# Patient Record
Sex: Male | Born: 1945 | Race: White | Hispanic: No | Marital: Married | Smoking: Never smoker
Health system: Southern US, Community
[De-identification: ages and names within clinical notes are randomized; demographics above are authoritative.]

## PROBLEM LIST (undated history)

## (undated) DIAGNOSIS — I10 Essential (primary) hypertension: Secondary | ICD-10-CM

## (undated) DIAGNOSIS — R011 Cardiac murmur, unspecified: Secondary | ICD-10-CM

## (undated) DIAGNOSIS — E119 Type 2 diabetes mellitus without complications: Secondary | ICD-10-CM

## (undated) DIAGNOSIS — N400 Enlarged prostate without lower urinary tract symptoms: Secondary | ICD-10-CM

## (undated) DIAGNOSIS — F1021 Alcohol dependence, in remission: Secondary | ICD-10-CM

## (undated) HISTORY — PX: MELANOMA EXCISION: SHX5266

## (undated) HISTORY — PX: FOOT SURGERY: SHX648

## (undated) HISTORY — PX: HERNIA REPAIR: SHX51

## (undated) HISTORY — PX: REPLACEMENT TOTAL KNEE: SUR1224

---
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
## 1978-11-11 HISTORY — PX: NASAL SEPTUM SURGERY: SHX37

## 1988-11-10 HISTORY — PX: CARPAL TUNNEL RELEASE: SHX101

## 2014-03-03 ENCOUNTER — Emergency Department (HOSPITAL_COMMUNITY)
Admission: EM | Admit: 2014-03-03 | Discharge: 2014-03-03 | Disposition: A | Discharge status: Home / Self Care | Payer: Medicare HMO | Attending: Emergency Medicine | Admitting: Emergency Medicine

## 2014-03-03 ENCOUNTER — Emergency Department (HOSPITAL_COMMUNITY): Payer: Medicare HMO

## 2014-03-03 ENCOUNTER — Encounter (HOSPITAL_COMMUNITY): Payer: Self-pay | Admitting: *Deleted

## 2014-03-03 DIAGNOSIS — S76111A Strain of right quadriceps muscle, fascia and tendon, initial encounter: Secondary | ICD-10-CM | POA: Diagnosis not present

## 2014-03-03 DIAGNOSIS — Y998 Other external cause status: Secondary | ICD-10-CM | POA: Diagnosis not present

## 2014-03-03 DIAGNOSIS — Y9389 Activity, other specified: Secondary | ICD-10-CM | POA: Diagnosis not present

## 2014-03-03 DIAGNOSIS — R011 Cardiac murmur, unspecified: Secondary | ICD-10-CM | POA: Diagnosis not present

## 2014-03-03 DIAGNOSIS — I10 Essential (primary) hypertension: Secondary | ICD-10-CM | POA: Diagnosis not present

## 2014-03-03 DIAGNOSIS — E119 Type 2 diabetes mellitus without complications: Secondary | ICD-10-CM | POA: Diagnosis not present

## 2014-03-03 DIAGNOSIS — W1830XA Fall on same level, unspecified, initial encounter: Secondary | ICD-10-CM | POA: Insufficient documentation

## 2014-03-03 DIAGNOSIS — W19XXXA Unspecified fall, initial encounter: Secondary | ICD-10-CM

## 2014-03-03 DIAGNOSIS — Z79899 Other long term (current) drug therapy: Secondary | ICD-10-CM | POA: Diagnosis not present

## 2014-03-03 DIAGNOSIS — Z791 Long term (current) use of non-steroidal anti-inflammatories (NSAID): Secondary | ICD-10-CM | POA: Insufficient documentation

## 2014-03-03 DIAGNOSIS — Y9289 Other specified places as the place of occurrence of the external cause: Secondary | ICD-10-CM | POA: Insufficient documentation

## 2014-03-03 DIAGNOSIS — S8991XA Unspecified injury of right lower leg, initial encounter: Secondary | ICD-10-CM | POA: Diagnosis present

## 2014-03-03 DIAGNOSIS — Z87891 Personal history of nicotine dependence: Secondary | ICD-10-CM | POA: Insufficient documentation

## 2014-03-03 HISTORY — DX: Type 2 diabetes mellitus without complications: E11.9

## 2014-03-03 HISTORY — DX: Essential (primary) hypertension: I10

## 2014-03-03 HISTORY — DX: Cardiac murmur, unspecified: R01.1

## 2014-03-03 MED ORDER — OXYCODONE-ACETAMINOPHEN 5-325 MG PO TABS: 1.0000 | ORAL_TABLET | ORAL | Status: DC | PRN

## 2014-03-03 NOTE — ED Notes (Signed)
Pt slipped on ramp today, denies hitting head, denies blood thinners, c/o right knee pain

## 2014-03-03 NOTE — Discharge Instructions (Signed)
Tendon Injury Tendons are strong, cordlike structures that connect muscle to bone. Tendons are made up of woven fibers, like a rope. A tendon injury is a tear (rupture) of the tendon. The rupture may be partial (only a few of the fibers in your tendon rupture) or complete (your entire tendon ruptures). CAUSES  Tendon injuries can be caused by high-stress activities, such as sports. They also can be caused by a repetitive injury or by a single injury from an excessive, rapid force. SYMPTOMS  Symptoms of tendon injury include pain when you move the joint close to the tendon. Other symptoms are swelling, redness, and warmth. DIAGNOSIS  Tendon injuries often can be diagnosed by physical exam. However, sometimes an X-ray exam or advanced imaging, such as magnetic resonance imaging (MRI), is necessary to determine the extent of the injury. TREATMENT  Partial tendon ruptures often can be treated with immobilization. A splint, bandage, or removable brace usually is used to immobilize the injured tendon. Most injured tendons need to be immobilized for 1-2 months before they are completely healed. Complete tendon ruptures may require surgical reattachment. Document Released: 04/05/2004 Document Revised: 02/15/2011 Document Reviewed: 05/20/2011 [facility] Patient Information 2015 [place], [place]. This information is not intended to replace advice given to you by your health care provider. Make sure you discuss any questions you have with your health care provider.

## 2014-03-03 NOTE — ED Notes (Signed)
Ice pack to lt knee,

## 2014-03-03 NOTE — ED Provider Notes (Signed)
Patient slipped on wet ramp today injuring his right knee. No other associated symptoms complains of right knee pain, nonradiating, anterior pain is worse with movement of his knee. On exam alert Glasgow Coma Score 15. Right lower extremity skin intact. There is a step-off immediately superior to the patella. DP pulse 2+  Orlie Dakin, MD 03/03/14 1441

## 2014-03-03 NOTE — ED Provider Notes (Signed)
CSN: [id number]     Arrival date & time 03/03/14  1205 History   First MD Initiated Contact with Patient 03/03/14 1218     Chief Complaint  Patient presents with  . Fall     (Consider location/radiation/quality/duration/timing/severity/associated sxs/prior Treatment) HPI  Larry Graves is a 68 y.o. male who presents to the Emergency Department complaining of right knee pain since a fall earlier today.  He states that he slipped and fell on a wet ramp, landing on his knee.  He believes that his knee may have twisted or bent bend him as he fell.  He reports a deformity noticed just above his kneecap and felt a "crunching" sensation to his knee when he tried to straighten it.  He has applied ice and reports some improvement in his pain, but states he is still unable to bear weight to his knee or bend it completely.  He denies other injuries, dizziness, vomiting or LOC.  Pt has h/o left [place]KR "years ago" in [place].     Past Medical History  Diagnosis Date  . Hypertension   . Diabetes mellitus without complication   . Heart murmur    Past Surgical History  Procedure Laterality Date  . Replacement total knee    . Carpal tunnel release    . Appendectomy    . Hernia repair    . Nasal septum surgery    . Foot surgery     History reviewed. No pertinent family history. History  Substance Use Topics  . Smoking status: Former Research scientist (life sciences)  . Smokeless tobacco: Not on file  . Alcohol Use: No    Review of Systems  Constitutional: Negative for fever and chills.  Respiratory: Negative for shortness of breath.   Genitourinary: Negative for dysuria and difficulty urinating.  Musculoskeletal: Positive for joint swelling and arthralgias. Negative for neck pain.  Skin: Negative for color change and wound.  Neurological: Negative for dizziness, syncope, facial asymmetry, weakness, numbness and headaches.  All other systems reviewed and are negative.     Allergies  Bacitracin and  Dicloxacillin  Home Medications   Prior to Admission medications   Medication Sig Start Date End Date Taking? Authorizing Provider  lisinopril (PRINIVIL,ZESTRIL) 10 MG tablet Take 10 mg by mouth daily.   Yes Historical Provider, MD  metoprolol succinate (TOPROL-XL) 100 MG 24 hr tablet Take 100 mg by mouth daily. Take with or immediately following a meal.   Yes Historical Provider, MD  Multiple Vitamin (MULTIVITAMIN WITH MINERALS) TABS tablet Take 1 tablet by mouth daily.   Yes Historical Provider, MD  Multiple Vitamins-Minerals (PRESERVISION AREDS 2 PO) Take 1 capsule by mouth daily.   Yes Historical Provider, MD  naproxen (NAPROSYN) 500 MG tablet Take 500 mg by mouth 2 (two) times daily with a meal.   Yes Historical Provider, MD  vitamin B-12 (CYANOCOBALAMIN) 1000 MCG tablet Take 1,000 mcg by mouth daily.   Yes Historical Provider, MD  vitamin C (ASCORBIC ACID) 500 MG tablet Take 500 mg by mouth daily.   Yes Historical Provider, MD   BP 138/91 mmHg  Pulse 84  Temp(Src) 98.3 F (36.8 C) (Oral)  Ht 5\' 3"  (1.6 m)  Wt 180 lb (81.647 kg)  BMI 31.89 kg/m2  SpO2 100% Physical Exam  Constitutional: He is oriented to person, place, and time. He appears well-developed and well-nourished. No distress.  HENT:  Head: Normocephalic and atraumatic.  Neck: Normal range of motion. Neck supple.  Cardiovascular: Normal rate, regular rhythm,  normal heart sounds and intact distal pulses.   No murmur heard. Pulmonary/Chest: Effort normal and breath sounds normal. No respiratory distress.  Musculoskeletal: He exhibits tenderness. He exhibits no edema.  Step off deformity noted proximal to the patella.  Patella is not high riding.  Pt is unable to perform a SLR.   No erythema, or effusion,  DP pulse brisk, distal sensation intact. Compartments are soft.    Neurological: He is alert and oriented to person, place, and time. He exhibits normal muscle tone. Coordination normal.  Skin: Skin is warm and dry. No  erythema.  Psychiatric: He has a normal mood and affect.  Nursing note and vitals reviewed.   ED Course  Procedures (including critical care time) Labs Review Labs Reviewed - No data to display  Imaging Review Mr Knee Right Wo Contrast  03/03/2014   CLINICAL DATA:  Right knee injury today with limited extension, pain and weakness. No previous relevant surgery. Initial encounter.  EXAM: MRI OF THE RIGHT KNEE WITHOUT CONTRAST  TECHNIQUE: Multiplanar, multisequence MR imaging of the knee was performed. No intravenous contrast was administered.  COMPARISON:  None.  FINDINGS: The study is moderately motion degraded, despite repeating several sequences  MENISCI  Medial meniscus: Mild degenerative signal within the posterior horn and body. No evidence of meniscal tear or displaced fragment.  Lateral meniscus:  Intact with normal morphology.  LIGAMENTS  Cruciates:  Intact.  Collaterals:  Intact.  CARTILAGE  Patellofemoral:  Preserved.  Medial:  Minimal chondral thinning without focal defect.  Lateral:  Preserved.  Joint:  Small knee joint effusion.  No loose bodies observed.  Popliteal Fossa:  Unremarkable. No significant Baker's cyst.  Extensor Mechanism: The distal quadriceps tendon is torn just proximal to its insertion on the patella. This appears to reflect a full-thickness tear, although there is no significant tendon retraction. There is extensive anterior extravasation of joint fluid into prepatellar soft tissues. There is edema within the quadriceps musculature. The patellar tendon is intact. There is spurring at the upper and lower poles of the patella.  Bones:  No acute or significant extra-articular osseous findings.  IMPRESSION: 1. Rupture of the distal quadriceps tendon without significant retraction or laxity of the patellar tendon. 2. Associated anterior extravasation of joint fluid. 3. No acute osseous findings.  No evidence of internal derangement.   Electronically Signed   By: Camie Patience  M.D.   On: 03/03/2014 14:21     EKG Interpretation None      MDM   Final diagnoses:  Fall  Tendon rupture, traumatic. quadriceps, right, initial encounter   Injury and exam are concerning for quadriceps tendon tear.  No bony injuries noted.  Will order MRI for further evaluation.    Patient aslo seen Dr. Winfred Leeds.    I have consulted Dr. Aline Brochure.  Recommends knee immobilizer and pt has own walker. Patient advised to be non weight bearing.   Dr. Aline Brochure will see pt in his office on Monday 03/08/14 at 9:00 am. Rx for percocet.  He appears stable for d/c and agrees to plan.    Zeynab Klett L. Ammie Ferrier 03/03/14 [place], MD 03/04/14 [phone]

## 2014-03-08 ENCOUNTER — Other Ambulatory Visit: Payer: Self-pay

## 2014-03-08 ENCOUNTER — Encounter (HOSPITAL_COMMUNITY): Payer: Self-pay

## 2014-03-08 ENCOUNTER — Telehealth: Payer: Self-pay | Admitting: Orthopedic Surgery

## 2014-03-08 ENCOUNTER — Ambulatory Visit (INDEPENDENT_AMBULATORY_CARE_PROVIDER_SITE_OTHER): Payer: Medicare HMO | Admitting: Orthopedic Surgery

## 2014-03-08 ENCOUNTER — Encounter (HOSPITAL_COMMUNITY)
Admission: RE | Admit: 2014-03-08 | Discharge: 2014-03-08 | Disposition: A | Discharge status: Home / Self Care | Payer: Medicare HMO | Source: Ambulatory Visit | Attending: Orthopedic Surgery | Admitting: Orthopedic Surgery

## 2014-03-08 ENCOUNTER — Encounter: Payer: Self-pay | Admitting: Orthopedic Surgery

## 2014-03-08 VITALS — BP 159/107 | Ht 63.0 in | Wt 180.0 lb

## 2014-03-08 DIAGNOSIS — Z79899 Other long term (current) drug therapy: Secondary | ICD-10-CM | POA: Diagnosis not present

## 2014-03-08 DIAGNOSIS — S76111A Strain of right quadriceps muscle, fascia and tendon, initial encounter: Secondary | ICD-10-CM

## 2014-03-08 DIAGNOSIS — Y929 Unspecified place or not applicable: Secondary | ICD-10-CM | POA: Diagnosis not present

## 2014-03-08 DIAGNOSIS — E119 Type 2 diabetes mellitus without complications: Secondary | ICD-10-CM | POA: Diagnosis not present

## 2014-03-08 DIAGNOSIS — Y999 Unspecified external cause status: Secondary | ICD-10-CM | POA: Diagnosis not present

## 2014-03-08 DIAGNOSIS — W010XXA Fall on same level from slipping, tripping and stumbling without subsequent striking against object, initial encounter: Secondary | ICD-10-CM | POA: Diagnosis not present

## 2014-03-08 DIAGNOSIS — I10 Essential (primary) hypertension: Secondary | ICD-10-CM | POA: Diagnosis not present

## 2014-03-08 DIAGNOSIS — Z87891 Personal history of nicotine dependence: Secondary | ICD-10-CM | POA: Diagnosis not present

## 2014-03-08 HISTORY — DX: Alcohol dependence, in remission: F10.21

## 2014-03-08 HISTORY — DX: Benign prostatic hyperplasia without lower urinary tract symptoms: N40.0

## 2014-03-08 LAB — HEMOGLOBIN AND HEMATOCRIT, BLOOD
HEMATOCRIT: 45 % (ref 39.0–52.0)
Hemoglobin: 15.8 g/dL (ref 13.0–17.0)

## 2014-03-08 LAB — BASIC METABOLIC PANEL
Anion gap: 9 (ref 5–15)
BUN: 23 mg/dL (ref 6–23)
CHLORIDE: 100 meq/L (ref 96–112)
CO2: 31 mmol/L (ref 19–32)
Calcium: 9.8 mg/dL (ref 8.4–10.5)
Creatinine, Ser: 0.92 mg/dL (ref 0.50–1.35)
GFR calc Af Amer: >90 mL/min (ref >90)
GFR calc non Af Amer: 86 mL/min — ABNORMAL LOW (ref >90)
GLUCOSE: 136 mg/dL — AB (ref 70–99)
Potassium: 4.3 mmol/L (ref 3.5–5.1)
SODIUM: 140 mmol/L (ref 135–145)

## 2014-03-08 LAB — SURGICAL PCR SCREEN
MRSA, PCR: NEGATIVE
Staphylococcus aureus: NEGATIVE

## 2014-03-08 MED ORDER — CHLORHEXIDINE GLUCONATE 4 % EX LIQD: 60.0000 mL | Freq: Once | CUTANEOUS | Status: DC

## 2014-03-08 NOTE — Addendum Note (Signed)
Addended by: Carole Civil on: 03/08/2014 09:54 AM   Modules accepted: Level of Service

## 2014-03-08 NOTE — Telephone Encounter (Signed)
Regarding out-patient surgery scheduled at [facility], 03/10/14, CPT code [phone], contacted insurer, [employer], [phone]; per Automated Voice Response system,  no pre-authorization is required.  Reference# [id number], received 03/09/14 at 3:29p.m.

## 2014-03-08 NOTE — Patient Instructions (Addendum)
Larry Graves  03/08/2014   Your procedure is scheduled on:  03/10/14  Report to [facility] at 09:00 AM.  Call this number if you have problems the morning of surgery: [phone]   Remember:   Do not eat food or drink liquids after midnight.   Take these medicines the morning of surgery with A SIP OF WATER: Lisinopril. You may take your Oxycodone if needed.   Do not wear jewelry, make-up or nail polish.  Do not wear lotions, powders, or perfumes. You may wear deodorant.  Do not shave 48 hours prior to surgery. Men may shave face and neck.  Do not bring valuables to the hospital.  [facility] is not responsible for any belongings or valuables.               Contacts, dentures or bridgework may not be worn into surgery.  Leave suitcase in the car. After surgery it may be brought to your room.  For patients admitted to the hospital, discharge time is determined by your treatment team.               Patients discharged the day of surgery will not be allowed to drive home.    Special Instructions: Shower using CHG bath (Hibiclens) the night before surgery and the morning of surgery. Use the one bottle for both showers.   Please read over the following fact sheets that you were given: Anesthesia Post-op Instructions    Tendon Repair You have an injured tendon. This is a cord like structure that connects muscles to bones. Muscles and tendons work together to move your arms, legs, fingers, and toes. Your doctor may repair your tendon by sewing it back together. Following this repair the tendon needs to be immobilized (made to not move) and not used to allow it to heal. Sometimes the condition of the wound and the type of damage done may make it necessary to simply clean and repair the wound and then go back in and repair the tendon in about one week to ten days to obtain a better result. Sometimes x-rays are required to make sure there is no additional boney injury. With complete rupture  (break) of a tendon, surgical repair with casting is necessary. Surgery allows the surgeon to put the tendon back together. The cast is used to allow the repair time to heal. The injury may be put in a caste or immobilized for 6 to 10 weeks. Immobilization means that the tendon injured is kept in position with a cast or splint. Once your caregiver feels you have healed well enough following this injury, they will provide exercises you can do to rehabilitate (make better) the injured tendon. HOME CARE INSTRUCTIONS   Do not use the injured tendon for as long as directed by your caregiver.  Rest the tendon as directed. Do not use it for lifting, walking, etc.  Leave the splint or dressing in place for as long as directed by your caregiver. Return for your first dressing change as directed.  Keep the dressing clean and dry.  Keep the injured area raised above the level of your heart as much as possible for the first week. SEEK MEDICAL CARE IF:   You have increased bleeding (more than a small spot) from the wound or from beneath your cast or splint.  You have redness, swelling, or increasing pain in the wound or from beneath your cast or splint.  You have pus coming from the wound or  from beneath the cast or splint.  You notice a bad smell coming from the wound or dressing or from beneath your cast or splint.  You develop increasing pain and swelling, not controlled with medicine. SEEK IMMEDIATE MEDICAL CARE IF:   You have a fever.  You develop a rash.  You have difficulty breathing.  You have any allergic problems. Document Released: 08/22/2000 Document Revised: 05/21/2011 Document Reviewed: 01/13/2007 [facility] Patient Information 2015 [place], [place]. This information is not intended to replace advice given to you by your health care provider. Make sure you discuss any questions you have with your health care provider.      PATIENT INSTRUCTIONS POST-ANESTHESIA  IMMEDIATELY  FOLLOWING SURGERY:  Do not drive or operate machinery for the first twenty four hours after surgery.  Do not make any important decisions for twenty four hours after surgery or while taking narcotic pain medications or sedatives.  If you develop intractable nausea and vomiting or a severe headache please notify your doctor immediately.  FOLLOW-UP:  Please make an appointment with your surgeon as instructed. You do not need to follow up with anesthesia unless specifically instructed to do so.  WOUND CARE INSTRUCTIONS (if applicable):  Keep a dry clean dressing on the anesthesia/puncture wound site if there is drainage.  Once the wound has quit draining you may leave it open to air.  Generally you should leave the bandage intact for twenty four hours unless there is drainage.  If the epidural site drains for more than 36-48 hours please call the anesthesia department.  QUESTIONS?:  Please feel free to call your physician or the hospital operator if you have any questions, and they will be happy to assist you.

## 2014-03-08 NOTE — Patient Instructions (Signed)
SURGERY, RIGHT QUAD TENDON REPAIR, Wednesday 03/10/14

## 2014-03-08 NOTE — Progress Notes (Signed)
Patient ID: Larry Graves, male   DOB: 1945/12/06, 68 y.o.   MRN: [id number] Patient ID: ESSA MALACHI, male   DOB: [birth year], 68 y.o.   MRN: [id number]  Chief Complaint  Patient presents with  . Follow-up    hospital follow up, discuss surgery, right knee, DOI 03/03/14    HPI DAIL LEREW is a 68 y.o. male.   HPI  he was injured on December 23. Slipped on a wet ramp on his shed and it is right knee went to the ER had an MRI which showed a torn quadriceps tendon. His clinical exam warrants at as well.  He complains of mild discomfort inability to extend his knee can walk in a knee immobilizer  I've explained to him the surgical procedure needed to repair his quadriceps tendon. He agrees to surgery.   Past Medical History  Diagnosis Date  . Hypertension   . Diabetes mellitus without complication   . Heart murmur     Past Surgical History  Procedure Laterality Date  . Replacement total knee    . Carpal tunnel release    . Appendectomy    . Hernia repair    . Nasal septum surgery    . Foot surgery      History reviewed. No pertinent family history.  Social History History  Substance Use Topics  . Smoking status: Former Research scientist (life sciences)  . Smokeless tobacco: Not on file  . Alcohol Use: No    Allergies  Allergen Reactions  . Bacitracin Rash  . Dicloxacillin Rash    Current Outpatient Prescriptions  Medication Sig Dispense Refill  . lisinopril (PRINIVIL,ZESTRIL) 10 MG tablet Take 10 mg by mouth daily.    . metFORMIN (GLUCOPHAGE) 500 MG tablet Take by mouth daily with breakfast.    . Multiple Vitamin (MULTIVITAMIN WITH MINERALS) TABS tablet Take 1 tablet by mouth daily.    . Multiple Vitamins-Minerals (PRESERVISION AREDS 2 PO) Take 1 capsule by mouth daily.    . naproxen (NAPROSYN) 500 MG tablet Take 500 mg by mouth 2 (two) times daily with a meal.    . tamsulosin (FLOMAX) 0.4 MG CAPS capsule Take 0.4 mg by mouth.    . vitamin B-12 (CYANOCOBALAMIN) 1000 MCG tablet Take 1,000  mcg by mouth daily.    . vitamin C (ASCORBIC ACID) 500 MG tablet Take 500 mg by mouth daily.    . metoprolol succinate (TOPROL-XL) 100 MG 24 hr tablet Take 100 mg by mouth daily. Take with or immediately following a meal.    . oxyCODONE-acetaminophen (PERCOCET/ROXICET) 5-325 MG per tablet Take 1 tablet by mouth every 4 (four) hours as needed. (Patient not taking: Reported on 03/08/2014) 20 tablet 0   No current facility-administered medications for this visit.    Review of Systems Review of Systems  Blood pressure 159/107, height 5\' 3"  (1.6 m), weight 180 lb (81.647 kg).  Physical Exam Physical Exam  Constitutional: He is oriented to person, place, and time. He appears well-developed and well-nourished. No distress.  HENT:  Head: Normocephalic.  Eyes: Pupils are equal, round, and reactive to light.  Neck: Normal range of motion. Neck supple. No JVD present. No thyromegaly present.  Cardiovascular: Normal rate and intact distal pulses.   Pulmonary/Chest: Effort normal.  Abdominal: Soft. He exhibits no distension.  Lymphadenopathy:    He has no cervical adenopathy.  Neurological: He is alert and oriented to person, place, and time. He has normal reflexes. He exhibits normal muscle tone. Coordination normal.  Upper extremities a re normal   Left leg is normal   Skin: Skin is warm and dry. No rash noted. He is not diaphoretic. No erythema. No pallor.  Psychiatric: He has a normal mood and affect. His behavior is normal. Judgment and thought content normal.  Nursing note and vitals reviewed.  Right leg: Palpable defect in the right suprapatellar region with tenderness of the right quadriceps. Small amount of bruising and ecchymosis around the skin. Neurovascular exam is intact.  The patient cannot do a straight leg raise. We do note that the straight leg raise maneuver is positive for quadriceps tendon rupture. The knee otherwise stable. Passive range of motion is normal to 90 but  painful after that.  Left knee total knee replacement knee flexion 90     Right quadriceps tendon rupture   Open right quadriceps tendon repair  Data Reviewed MRI and x-ray right knee independent review patella is low riding. MRI shows quadriceps tendon rupture   Extensor Mechanism: The distal quadriceps tendon is torn just proximal to its insertion on the patella. This appears to reflect a full-thickness tear, although there is no significant tendon retraction. There is extensive anterior extravasation of joint fluid into prepatellar soft tissues. There is edema within the quadriceps musculature. The patellar tendon is intact. There is spurring at the upper and lower poles of the patella.   Bones:  No acute or significant extra-articular osseous findings.   IMPRESSION: 1. Rupture of the distal quadriceps tendon without significant retraction or laxity of the patellar tendon. 2. Associated anterior extravasation of joint fluid. 3. No acute osseous findings.  No evidence of internal derangement.   Assessment    Encounter Diagnosis  Name Primary?  . Quadriceps muscle rupture, right, initial encounter Yes        Plan    Open repair right quadriceps tendon        Arther Abbott 03/08/2014, 9:14 AM

## 2014-03-09 NOTE — H&P (Signed)
Patient ID: Larry Graves, male   DOB: 05-29-1945, 68 y.o.   MRN: [id number]    Chief Complaint   Patient presents with   .  Follow-up       hospital follow up, discuss surgery, right knee, DOI 03/03/14     HPI Larry Graves is a 68 y.o. male.   HPI he was injured on December 23. Slipped on a wet ramp on his shed and it is right knee went to the ER had an MRI which showed a torn quadriceps tendon. His clinical exam warrants at as well.  He complains of mild discomfort inability to extend his knee can walk in a knee immobilizer  I've explained to him the surgical procedure needed to repair his quadriceps tendon. He agrees to surgery.      Past Medical History   Diagnosis  Date   .  Hypertension     .  Diabetes mellitus without complication     .  Heart murmur         Past Surgical History   Procedure  Laterality  Date   .  Replacement total knee       .  Carpal tunnel release       .  Appendectomy       .  Hernia repair       .  Nasal septum surgery       .  Foot surgery         History reviewed. No pertinent family history.  Social History History   Substance Use Topics   .  Smoking status:  Former Research scientist (life sciences)   .  Smokeless tobacco:  Not on file   .  Alcohol Use:  No       Allergies   Allergen  Reactions   .  Bacitracin  Rash   .  Dicloxacillin  Rash       Current Outpatient Prescriptions   Medication  Sig  Dispense  Refill   .  lisinopril (PRINIVIL,ZESTRIL) 10 MG tablet  Take 10 mg by mouth daily.       .  metFORMIN (GLUCOPHAGE) 500 MG tablet  Take by mouth daily with breakfast.       .  Multiple Vitamin (MULTIVITAMIN WITH MINERALS) TABS tablet  Take 1 tablet by mouth daily.       .  Multiple Vitamins-Minerals (PRESERVISION AREDS 2 PO)  Take 1 capsule by mouth daily.       .  naproxen (NAPROSYN) 500 MG tablet  Take 500 mg by mouth 2 (two) times daily with a meal.       .  tamsulosin (FLOMAX) 0.4 MG CAPS capsule  Take 0.4 mg by mouth.       .  vitamin B-12  (CYANOCOBALAMIN) 1000 MCG tablet  Take 1,000 mcg by mouth daily.       .  vitamin C (ASCORBIC ACID) 500 MG tablet  Take 500 mg by mouth daily.       .  metoprolol succinate (TOPROL-XL) 100 MG 24 hr tablet  Take 100 mg by mouth daily. Take with or immediately following a meal.       .  oxyCODONE-acetaminophen (PERCOCET/ROXICET) 5-325 MG per tablet  Take 1 tablet by mouth every 4 (four) hours as needed. (Patient not taking: Reported on 03/08/2014)  20 tablet  0      No current facility-administered medications for this visit.     Review of Systems Review of Systems  Blood pressure 159/107, height 5\' 3"  (1.6 m), weight 180 lb (81.647 kg).  Physical Exam Physical Exam  Constitutional: He is oriented to person, place, and time. He appears well-developed and well-nourished. No distress.  HENT:   Head: Normocephalic.  Eyes: Pupils are equal, round, and reactive to light.  Neck: Normal range of motion. Neck supple. No JVD present. No thyromegaly present.  Cardiovascular: Normal rate and intact distal pulses.   Pulmonary/Chest: Effort normal.  Abdominal: Soft. He exhibits no distension.  Lymphadenopathy:    He has no cervical adenopathy.  Neurological: He is alert and oriented to person, place, and time. He has normal reflexes. He exhibits normal muscle tone. Coordination normal.  Upper extremities a re normal   Left leg is normal   Skin: Skin is warm and dry. No rash noted. He is not diaphoretic. No erythema. No pallor.  Psychiatric: He has a normal mood and affect. His behavior is normal. Judgment and thought content normal.  Nursing note and vitals reviewed.  Right leg: Palpable defect in the right suprapatellar region with tenderness of the right quadriceps. Small amount of bruising and ecchymosis around the skin. Neurovascular exam is intact.  The patient cannot do a straight leg raise. We do note that the straight leg raise maneuver is positive for quadriceps tendon rupture. The knee  otherwise stable. Passive range of motion is normal to 90 but painful after that.  Left knee total knee replacement knee flexion 90     Right quadriceps tendon rupture   Open right quadriceps tendon repair  Data Reviewed MRI and x-ray right knee independent review patella is low riding. MRI shows quadriceps tendon rupture   Extensor Mechanism: The distal quadriceps tendon is torn just proximal to its insertion on the patella. This appears to reflect a full-thickness tear, although there is no significant tendon retraction. There is extensive anterior extravasation of joint fluid into prepatellar soft tissues. There is edema within the quadriceps musculature. The patellar tendon is intact. There is spurring at the upper and lower poles of the patella.   Bones:  No acute or significant extra-articular osseous findings.   IMPRESSION: 1. Rupture of the distal quadriceps tendon without significant retraction or laxity of the patellar tendon. 2. Associated anterior extravasation of joint fluid. 3. No acute osseous findings.  No evidence of internal derangement.   Assessment    Encounter Diagnosis   Name  Primary?   .  Quadriceps muscle rupture, right, initial encounter  Yes         Plan    Open repair right quadriceps tendon        Arther Abbott 03/08/2014, 9:14 AM  Mri report     Extensor Mechanism: The distal quadriceps tendon is torn just proximal to its insertion on the patella. This appears to reflect a full-thickness tear, although there is no significant tendon retraction. There is extensive anterior extravasation of joint fluid into prepatellar soft tissues. There is edema within the quadriceps musculature. The patellar tendon is intact. There is spurring at the upper and lower poles of the patella.   Bones:  No acute or significant extra-articular osseous findings.   IMPRESSION: 1. Rupture of the distal quadriceps tendon without  significant retraction or laxity of the patellar tendon. 2. Associated anterior extravasation of joint fluid. 3. No acute osseous findings.  No evidence of internal derangement.

## 2014-03-10 ENCOUNTER — Ambulatory Visit (HOSPITAL_COMMUNITY): Payer: Medicare HMO | Admitting: Certified Registered Nurse Anesthetist

## 2014-03-10 ENCOUNTER — Encounter (HOSPITAL_COMMUNITY): Payer: Self-pay | Admitting: *Deleted

## 2014-03-10 ENCOUNTER — Ambulatory Visit (HOSPITAL_COMMUNITY)
Admission: RE | Admit: 2014-03-10 | Discharge: 2014-03-10 | Disposition: A | Discharge status: Home / Self Care | Payer: Medicare HMO | Source: Ambulatory Visit | Attending: Orthopedic Surgery | Admitting: Orthopedic Surgery

## 2014-03-10 ENCOUNTER — Encounter (HOSPITAL_COMMUNITY)
Admission: RE | Disposition: A | Discharge status: Home / Self Care | Payer: Self-pay | Source: Ambulatory Visit | Attending: Orthopedic Surgery

## 2014-03-10 DIAGNOSIS — S76111A Strain of right quadriceps muscle, fascia and tendon, initial encounter: Secondary | ICD-10-CM | POA: Diagnosis not present

## 2014-03-10 DIAGNOSIS — E119 Type 2 diabetes mellitus without complications: Secondary | ICD-10-CM | POA: Insufficient documentation

## 2014-03-10 DIAGNOSIS — S76119A Strain of unspecified quadriceps muscle, fascia and tendon, initial encounter: Secondary | ICD-10-CM | POA: Insufficient documentation

## 2014-03-10 DIAGNOSIS — I10 Essential (primary) hypertension: Secondary | ICD-10-CM | POA: Diagnosis not present

## 2014-03-10 DIAGNOSIS — Z87891 Personal history of nicotine dependence: Secondary | ICD-10-CM | POA: Insufficient documentation

## 2014-03-10 DIAGNOSIS — Y929 Unspecified place or not applicable: Secondary | ICD-10-CM | POA: Insufficient documentation

## 2014-03-10 DIAGNOSIS — Z79899 Other long term (current) drug therapy: Secondary | ICD-10-CM | POA: Insufficient documentation

## 2014-03-10 DIAGNOSIS — Y999 Unspecified external cause status: Secondary | ICD-10-CM | POA: Insufficient documentation

## 2014-03-10 DIAGNOSIS — W010XXA Fall on same level from slipping, tripping and stumbling without subsequent striking against object, initial encounter: Secondary | ICD-10-CM | POA: Insufficient documentation

## 2014-03-10 HISTORY — PX: QUADRICEPS TENDON REPAIR: SHX756

## 2014-03-10 LAB — GLUCOSE, CAPILLARY
GLUCOSE-CAPILLARY: 136 mg/dL — AB (ref 70–99)
Glucose-Capillary: 151 mg/dL — ABNORMAL HIGH (ref 70–99)

## 2014-03-10 SURGERY — REPAIR, TENDON, QUADRICEPS
Anesthesia: General | Site: Knee | Laterality: Right

## 2014-03-10 MED ORDER — KETOROLAC TROMETHAMINE 30 MG/ML IJ SOLN
30.0000 mg | Freq: Once | INTRAMUSCULAR | Status: AC
  Administered 2014-03-10: 30 mg via INTRAVENOUS

## 2014-03-10 MED ORDER — FENTANYL CITRATE 0.05 MG/ML IJ SOLN
INTRAMUSCULAR | Status: AC
  Filled 2014-03-10: qty 2

## 2014-03-10 MED ORDER — BUPIVACAINE-EPINEPHRINE (PF) 0.5% -1:200000 IJ SOLN
INTRAMUSCULAR | Status: AC
  Filled 2014-03-10: qty 60

## 2014-03-10 MED ORDER — HYDROCODONE-ACETAMINOPHEN 5-325 MG PO TABS
2.0000 | ORAL_TABLET | Freq: Once | ORAL | Status: AC
  Administered 2014-03-10: 2 via ORAL

## 2014-03-10 MED ORDER — PROMETHAZINE HCL 12.5 MG PO TABS: 12.5000 mg | ORAL_TABLET | Freq: Four times a day (QID) | ORAL | Status: DC | PRN

## 2014-03-10 MED ORDER — FENTANYL CITRATE 0.05 MG/ML IJ SOLN
25.0000 ug | INTRAMUSCULAR | Status: AC
  Administered 2014-03-10 (×2): 25 ug via INTRAVENOUS

## 2014-03-10 MED ORDER — ONDANSETRON HCL 4 MG/2ML IJ SOLN
4.0000 mg | Freq: Once | INTRAMUSCULAR | Status: AC
  Administered 2014-03-10: 4 mg via INTRAVENOUS

## 2014-03-10 MED ORDER — FENTANYL CITRATE 0.05 MG/ML IJ SOLN
INTRAMUSCULAR | Status: DC | PRN
  Administered 2014-03-10 (×2): 25 ug via INTRAVENOUS
  Administered 2014-03-10 (×2): 50 ug via INTRAVENOUS
  Administered 2014-03-10 (×2): 25 ug via INTRAVENOUS

## 2014-03-10 MED ORDER — PROPOFOL 10 MG/ML IV BOLUS
INTRAVENOUS | Status: DC | PRN
  Administered 2014-03-10: 170 mg via INTRAVENOUS

## 2014-03-10 MED ORDER — MIDAZOLAM HCL 2 MG/2ML IJ SOLN
1.0000 mg | INTRAMUSCULAR | Status: DC | PRN
Start: 2014-03-10 — End: 2014-03-10
  Administered 2014-03-10: 2 mg via INTRAVENOUS

## 2014-03-10 MED ORDER — PROPOFOL 10 MG/ML IV BOLUS
INTRAVENOUS | Status: AC
  Filled 2014-03-10: qty 20

## 2014-03-10 MED ORDER — ONDANSETRON HCL 4 MG/2ML IJ SOLN
4.0000 mg | Freq: Once | INTRAMUSCULAR | Status: DC | PRN
  Filled 2014-03-10: qty 2

## 2014-03-10 MED ORDER — MIDAZOLAM HCL 2 MG/2ML IJ SOLN
INTRAMUSCULAR | Status: AC
  Filled 2014-03-10: qty 2

## 2014-03-10 MED ORDER — EPHEDRINE SULFATE 50 MG/ML IJ SOLN
INTRAMUSCULAR | Status: DC | PRN
  Administered 2014-03-10: 10 mg via INTRAVENOUS

## 2014-03-10 MED ORDER — VANCOMYCIN HCL IN DEXTROSE 1-5 GM/200ML-% IV SOLN
INTRAVENOUS | Status: AC
  Filled 2014-03-10: qty 200

## 2014-03-10 MED ORDER — KETOROLAC TROMETHAMINE 30 MG/ML IJ SOLN
INTRAMUSCULAR | Status: AC
  Filled 2014-03-10: qty 1

## 2014-03-10 MED ORDER — ONDANSETRON HCL 4 MG/2ML IJ SOLN
INTRAMUSCULAR | Status: AC
  Filled 2014-03-10: qty 2

## 2014-03-10 MED ORDER — FENTANYL CITRATE 0.05 MG/ML IJ SOLN
25.0000 ug | INTRAMUSCULAR | Status: DC | PRN
  Administered 2014-03-10 (×2): 50 ug via INTRAVENOUS

## 2014-03-10 MED ORDER — VANCOMYCIN HCL IN DEXTROSE 1-5 GM/200ML-% IV SOLN
1000.0000 mg | INTRAVENOUS | Status: AC
  Administered 2014-03-10: 1000 mg via INTRAVENOUS

## 2014-03-10 MED ORDER — LIDOCAINE HCL (PF) 1 % IJ SOLN
INTRAMUSCULAR | Status: AC
  Filled 2014-03-10: qty 5

## 2014-03-10 MED ORDER — 0.9 % SODIUM CHLORIDE (POUR BTL) OPTIME
TOPICAL | Status: DC | PRN
  Administered 2014-03-10: 1000 mL

## 2014-03-10 MED ORDER — HYDROCODONE-ACETAMINOPHEN 5-325 MG PO TABS
ORAL_TABLET | ORAL | Status: AC
  Filled 2014-03-10: qty 2

## 2014-03-10 MED ORDER — SODIUM CHLORIDE 0.9 % IJ SOLN
INTRAMUSCULAR | Status: AC
  Filled 2014-03-10: qty 10

## 2014-03-10 MED ORDER — LACTATED RINGERS IV SOLN
INTRAVENOUS | Status: DC
  Administered 2014-03-10: 08:00:00 via INTRAVENOUS

## 2014-03-10 MED ORDER — BUPIVACAINE-EPINEPHRINE 0.5% -1:200000 IJ SOLN
INTRAMUSCULAR | Status: DC | PRN
  Administered 2014-03-10: 45 mL

## 2014-03-10 MED ORDER — OXYCODONE-ACETAMINOPHEN 5-325 MG PO TABS: 1.0000 | ORAL_TABLET | ORAL | Status: DC | PRN

## 2014-03-10 MED ORDER — EPHEDRINE SULFATE 50 MG/ML IJ SOLN
INTRAMUSCULAR | Status: AC
  Filled 2014-03-10: qty 1

## 2014-03-10 MED ORDER — LIDOCAINE HCL (CARDIAC) 20 MG/ML IV SOLN
INTRAVENOUS | Status: DC | PRN
  Administered 2014-03-10: 50 mg via INTRAVENOUS

## 2014-03-10 SURGICAL SUPPLY — 48 items
BAG HAMPER (MISCELLANEOUS) ×2 IMPLANT
BANDAGE ESMARK 4X12 BL STRL LF (DISPOSABLE) ×1 IMPLANT
BIT DRILL 2.8X128 (BIT) ×2 IMPLANT
BLADE 10 SAFETY STRL DISP (BLADE) ×2 IMPLANT
BNDG COHESIVE 4X5 TAN STRL (GAUZE/BANDAGES/DRESSINGS) ×2 IMPLANT
BNDG ESMARK 4X12 BLUE STRL LF (DISPOSABLE) ×2
CHLORAPREP W/TINT 26ML (MISCELLANEOUS) ×2 IMPLANT
CLOTH BEACON ORANGE TIMEOUT ST (SAFETY) ×2 IMPLANT
COVER LIGHT HANDLE STERIS (MISCELLANEOUS) ×4 IMPLANT
CUFF TOURNIQUET SINGLE 24IN (TOURNIQUET CUFF) ×2 IMPLANT
CUFF TOURNIQUET SINGLE 34IN LL (TOURNIQUET CUFF) IMPLANT
DRAPE INCISE IOBAN 66X45 STRL (DRAPES) ×2 IMPLANT
DRSG MEPILEX BORDER 4X12 (GAUZE/BANDAGES/DRESSINGS) ×2 IMPLANT
GAUZE XEROFORM 5X9 LF (GAUZE/BANDAGES/DRESSINGS) ×2 IMPLANT
GLOVE BIOGEL M 7.0 STRL (GLOVE) ×2 IMPLANT
GLOVE BIOGEL PI IND STRL 7.0 (GLOVE) ×2 IMPLANT
GLOVE BIOGEL PI INDICATOR 7.0 (GLOVE) ×2
GLOVE ECLIPSE 7.0 STRL STRAW (GLOVE) ×2 IMPLANT
GLOVE EXAM NITRILE MD LF STRL (GLOVE) ×2 IMPLANT
GLOVE SKINSENSE NS SZ8.0 LF (GLOVE) ×1
GLOVE SKINSENSE STRL SZ8.0 LF (GLOVE) ×1 IMPLANT
GLOVE SS N UNI LF 8.5 STRL (GLOVE) ×2 IMPLANT
GOWN STRL REUS W/TWL LRG LVL3 (GOWN DISPOSABLE) ×4 IMPLANT
GOWN STRL REUS W/TWL XL LVL3 (GOWN DISPOSABLE) ×2 IMPLANT
IMMOBILIZER KNEE 19 UNV (ORTHOPEDIC SUPPLIES) ×2 IMPLANT
INST SET MAJOR BONE (KITS) ×2 IMPLANT
KIT ROOM TURNOVER APOR (KITS) ×2 IMPLANT
MANIFOLD NEPTUNE II (INSTRUMENTS) ×2 IMPLANT
NEEDLE HYPO 21X1.5 SAFETY (NEEDLE) ×2 IMPLANT
NEEDLE MAYO 6 CRC TAPER PT (NEEDLE) IMPLANT
NS IRRIG 1000ML POUR BTL (IV SOLUTION) ×2 IMPLANT
PACK BASIC LIMB (CUSTOM PROCEDURE TRAY) ×2 IMPLANT
PAD ARMBOARD 7.5X6 YLW CONV (MISCELLANEOUS) ×2 IMPLANT
PASSER SUT SWANSON 36MM LOOP (INSTRUMENTS) IMPLANT
SET BASIN LINEN APH (SET/KITS/TRAYS/PACK) ×2 IMPLANT
SPONGE LAP 18X18 X RAY DECT (DISPOSABLE) ×2 IMPLANT
STAPLER VISISTAT 35W (STAPLE) ×2 IMPLANT
SUT BRALON NAB BRD #1 30IN (SUTURE) IMPLANT
SUT ETHIBOND 5 LR DA (SUTURE) ×6 IMPLANT
SUT ETHIBOND NAB OS 4 #2 30IN (SUTURE) IMPLANT
SUT ETHILON 3 0 FSL (SUTURE) IMPLANT
SUT MON AB 0 CT1 (SUTURE) ×4 IMPLANT
SUT MON AB 2-0 CT1 36 (SUTURE) IMPLANT
SUT PROLENE 3 0 PS 1 (SUTURE) IMPLANT
SUT VIC AB 1 CT1 27 (SUTURE) ×4
SUT VIC AB 1 CT1 27XBRD ANTBC (SUTURE) ×4 IMPLANT
SYR 30ML LL (SYRINGE) ×2 IMPLANT
SYR BULB IRRIGATION 50ML (SYRINGE) ×2 IMPLANT

## 2014-03-10 NOTE — Op Note (Signed)
03/10/2014  10:27 AM  PATIENT:  Larry Graves  68 y.o. male  PRE-OPERATIVE DIAGNOSIS:  right quadricept tendon tear  POST-OPERATIVE DIAGNOSIS:  right quadricept tendon tear  PROCEDURE:  Procedure(s): REPAIR QUADRICEP TENDON (Right)   Findings: complete rupture from patella with medial retinacular tear   SURGEON:  Surgeon(s) and Role:    * Carole Civil, MD - Primary  PHYSICIAN ASSISTANT:   ASSISTANTS: nicole small   ANESTHESIA:   general  EBL:     BLOOD ADMINISTERED:none  DRAINS: none   LOCAL MEDICATIONS USED:  MARCAINE   With epi 45cc  SPECIMEN:  No Specimen  DISPOSITION OF SPECIMEN:  N/A  COUNTS:  YES  TOURNIQUET:  * Missing tourniquet times found for documented tourniquets in log:  [id number] *  DICTATION: .Dragon Dictation  PLAN OF CARE: Discharge to home after PACU  PATIENT DISPOSITION:  PACU - hemodynamically stable.   Delay start of Pharmacological VTE agent (>24hrs) due to surgical blood loss or risk of bleeding: not applicable  Details of procedure the patient was identified in the preop holding area. Chart review was completed. Right lower extremity was marked with an indelible marker.  The patient was taken to the operating room for general anesthesia in the supine position.  Vancomycin was used as antibiotic due to previous history of staph infections.  After successful intubation right lower extremity was prepped with ChloraPrep draped sterilely and time out was completed. The limb was extended with a 4 inch Esmarch.  Midline incision was made starting above the patella and taken distally to the level of tibial tubercle. Subcutaneous tissue was divided. Incision was taken down to the quadriceps tendon patella and patellar tendon.  The tendon was avulsed from the patella. The joint was value weighted irrigated and there was no evidence of loose body or significant patellofemoral arthritis  Patella was debrided and the quadriceps tendon was  debrided sharply. A rongeur was used to remove bone from the patella to create a bleeding bone bed.  2 Krakw stitches were placed in the quadriceps tendon using #5 Ethibond suture. 3 drill holes were placed in the patella. A suture passer was used to pass the stitches and the stitches were tied with the knee in full extension.  The knee was flexed to 40 before there was any tension on the repair. We then placed a sterile #5 suture around the patella to decrease tension on the repair and we repaired the retinaculum with #1 running Vicryl. Wound was irrigated. Joint was injected with 30 mL of Marcaine with epinephrine and the skin was injected with 50 mL of Marcaine with epinephrine  Subcutaneous stitches were placed in interrupted fashion with 0 Monocryl  Skin reapproximated with staples  Sterile dressing was applied  The patient was placed in a knee immobilizer and extubated taken to recovery room in stable condition  Postoperative plan weightbearing as tolerated with knee immobilizer  When his staples come out we can place him in a T scope range of motion brace starting 0-30 range of motion at postop week #6 and increasing 20-30 until full range of motion as obtained.

## 2014-03-10 NOTE — Anesthesia Preprocedure Evaluation (Addendum)
Anesthesia Evaluation  Patient identified by MRN, date of birth, ID band Patient awake    Reviewed: Allergy & Precautions, H&P , NPO status , Patient's Chart, lab work & pertinent test results  History of Anesthesia Complications Negative for: history of anesthetic complications  Airway Mallampati: I  TM Distance: >3 FB     Dental  (+) Edentulous Upper, Edentulous Lower   Pulmonary neg pulmonary ROS, former smoker,          Cardiovascular hypertension, Pt. on medications Rhythm:Regular Rate:Normal     Neuro/Psych    GI/Hepatic negative GI ROS,   Endo/Other  diabetes, Well Controlled, Type 2, Oral Hypoglycemic Agents  Renal/GU      Musculoskeletal   Abdominal   Peds  Hematology   Anesthesia Other Findings   Reproductive/Obstetrics                            Anesthesia Physical Anesthesia Plan  ASA: II  Anesthesia Plan: General   Post-op Pain Management:    Induction: Intravenous  Airway Management Planned: LMA  Additional Equipment:   Intra-op Plan:   Post-operative Plan: Extubation in OR  Informed Consent: I have reviewed the patients History and Physical, chart, labs and discussed the procedure including the risks, benefits and alternatives for the proposed anesthesia with the patient or authorized representative who has indicated his/her understanding and acceptance.     Plan Discussed with:   Anesthesia Plan Comments:        Anesthesia Quick Evaluation

## 2014-03-10 NOTE — Interval H&P Note (Signed)
History and Physical Interval Note:  03/10/2014 8:30 AM  Larry Graves  has presented today for surgery, with the diagnosis of right quadricept tendon tear  The various methods of treatment have been discussed with the patient and family. After consideration of risks, benefits and other options for treatment, the patient has consented to  Procedure(s): REPAIR QUADRICEP TENDON (Right) as a surgical intervention .  The patient's history has been reviewed, patient examined, no change in status, stable for surgery.  I have reviewed the patient's chart and labs.  Questions were answered to the patient's satisfaction.     Arther Abbott

## 2014-03-10 NOTE — Brief Op Note (Signed)
03/10/2014  10:27 AM  PATIENT:  Larry Graves  68 y.o. male  PRE-OPERATIVE DIAGNOSIS:  right quadricept tendon tear  POST-OPERATIVE DIAGNOSIS:  right quadricept tendon tear  PROCEDURE:  Procedure(s): REPAIR QUADRICEP TENDON (Right)   Findings: complete rupture from patella with medial retinacular tear   SURGEON:  Surgeon(s) and Role:    * Carole Civil, MD - Primary  PHYSICIAN ASSISTANT:   ASSISTANTS: nicole small   ANESTHESIA:   general  EBL:     BLOOD ADMINISTERED:none  DRAINS: none   LOCAL MEDICATIONS USED:  MARCAINE   With epi 45cc  SPECIMEN:  No Specimen  DISPOSITION OF SPECIMEN:  N/A  COUNTS:  YES  TOURNIQUET:  * Missing tourniquet times found for documented tourniquets in log:  [id number] *  DICTATION: .Dragon Dictation  PLAN OF CARE: Discharge to home after PACU  PATIENT DISPOSITION:  PACU - hemodynamically stable.   Delay start of Pharmacological VTE agent (>24hrs) due to surgical blood loss or risk of bleeding: not applicable  Details of procedure the patient was identified in the preop holding area. Chart review was completed. Right lower extremity was marked with an indelible marker.  The patient was taken to the operating room for general anesthesia in the supine position.  Vancomycin was used as antibiotic due to previous history of staph infections.  After successful intubation right lower extremity was prepped with ChloraPrep draped sterilely and time out was completed. The limb was extended with a 4 inch Esmarch.  Midline incision was made starting above the patella and taken distally to the level of tibial tubercle. Subcutaneous tissue was divided. Incision was taken down to the quadriceps tendon patella and patellar tendon.  The tendon was avulsed from the patella. The joint was value weighted irrigated and there was no evidence of loose body or significant patellofemoral arthritis  Patella was debrided and the quadriceps tendon was  debrided sharply. A rongeur was used to remove bone from the patella to create a bleeding bone bed.  2 Krakw stitches were placed in the quadriceps tendon using #5 Ethibond suture. 3 drill holes were placed in the patella. A suture passer was used to pass the stitches and the stitches were tied with the knee in full extension.  The knee was flexed to 40 before there was any tension on the repair. We then placed a sterile #5 suture around the patella to decrease tension on the repair and we repaired the retinaculum with #1 running Vicryl. Wound was irrigated. Joint was injected with 30 mL of Marcaine with epinephrine and the skin was injected with 50 mL of Marcaine with epinephrine  Subcutaneous stitches were placed in interrupted fashion with 0 Monocryl  Skin reapproximated with staples  Sterile dressing was applied  The patient was placed in a knee immobilizer and extubated taken to recovery room in stable condition  Postoperative plan weightbearing as tolerated with knee immobilizer  When his staples come out we can place him in a T scope range of motion brace starting 0-30 range of motion at postop week #6 and increasing 20-30 until full range of motion as obtained.

## 2014-03-10 NOTE — Transfer of Care (Signed)
Immediate Anesthesia Transfer of Care Note  Patient: Larry Graves  Procedure(s) Performed: Procedure(s): REPAIR QUADRICEP TENDON (Right)  Patient Location: PACU  Anesthesia Type:General  Level of Consciousness: awake, alert  and oriented  Airway & Oxygen Therapy: Patient Spontanous Breathing and Patient connected to face mask oxygen  Post-op Assessment: Report given to PACU RN and Post -op Vital signs reviewed and stable  Post vital signs: Reviewed and stable  Complications: No apparent anesthesia complications

## 2014-03-11 ENCOUNTER — Encounter (HOSPITAL_COMMUNITY): Payer: Self-pay | Admitting: Orthopedic Surgery

## 2014-03-11 NOTE — Anesthesia Postprocedure Evaluation (Signed)
  Anesthesia Post-op Note  Late entry Patient: Larry Graves  Procedure(s) Performed: Procedure(s): REPAIR QUADRICEP TENDON (Right)  Patient Location: PACU  Anesthesia Type:General  Level of Consciousness: awake, alert , oriented and patient cooperative  Airway and Oxygen Therapy: Patient Spontanous Breathing  Post-op Pain: mild  Post-op Assessment: Post-op Vital signs reviewed, Patient's Cardiovascular Status Stable, Respiratory Function Stable, Patent Airway, No signs of Nausea or vomiting and Pain level controlled  Post-op Vital Signs: Reviewed and stable  Last Vitals:  Filed Vitals:   03/10/14 1138  BP: 149/73  Pulse: 86  Temp: 36.6 C  Resp: 16    Complications: No apparent anesthesia complications

## 2014-03-15 ENCOUNTER — Encounter: Payer: Self-pay | Admitting: Orthopedic Surgery

## 2014-03-15 ENCOUNTER — Ambulatory Visit (INDEPENDENT_AMBULATORY_CARE_PROVIDER_SITE_OTHER): Payer: Self-pay | Admitting: Orthopedic Surgery

## 2014-03-15 VITALS — BP 160/96 | Ht 63.0 in | Wt 182.0 lb

## 2014-03-15 DIAGNOSIS — S76111A Strain of right quadriceps muscle, fascia and tendon, initial encounter: Secondary | ICD-10-CM

## 2014-03-15 MED ORDER — HYDROCODONE-ACETAMINOPHEN 7.5-325 MG PO TABS
1.0000 | ORAL_TABLET | Freq: Four times a day (QID) | ORAL | Status: DC | PRN
Start: 2014-03-15 — End: 2014-05-06

## 2014-03-15 NOTE — Progress Notes (Signed)
Patient ID: Larry Graves, male   DOB: Apr 02, 1945, 69 y.o.   MRN: [id number] Postop visit #1  Encounter Diagnosis  Name Primary?  . Quadriceps muscle rupture, right, initial encounter Yes    Status post quadriceps tendon repair right knee  Wound looks clean dressing is changed. Calf is supple Homans sign is negative.  Continue knee immobilizer return on the 13th for staples out and application of T score brace 0-30

## 2014-03-23 ENCOUNTER — Ambulatory Visit: Payer: Medicare HMO | Admitting: Orthopedic Surgery

## 2014-03-24 ENCOUNTER — Ambulatory Visit (INDEPENDENT_AMBULATORY_CARE_PROVIDER_SITE_OTHER): Payer: Self-pay | Admitting: Orthopedic Surgery

## 2014-03-24 VITALS — BP 92/68 | Ht 63.0 in | Wt 182.0 lb

## 2014-03-24 DIAGNOSIS — S76111D Strain of right quadriceps muscle, fascia and tendon, subsequent encounter: Secondary | ICD-10-CM

## 2014-03-24 NOTE — Progress Notes (Signed)
Patient ID: Larry Graves, male   DOB: [birth year], 69 y.o.   MRN: [id number] Chief Complaint  Patient presents with  . Follow-up    post op 2, Rt Quad Tendon, DOS 03/10/14    Quadriceps tendon repair right knee suture line looks good staples taken out patient placed in hinged brace 0-30 weight-bear as tolerated come back 2 weeks advance 30

## 2014-04-08 ENCOUNTER — Ambulatory Visit (INDEPENDENT_AMBULATORY_CARE_PROVIDER_SITE_OTHER): Payer: Self-pay | Admitting: Orthopedic Surgery

## 2014-04-08 ENCOUNTER — Encounter: Payer: Self-pay | Admitting: Orthopedic Surgery

## 2014-04-08 VITALS — BP 142/92 | Ht 63.0 in | Wt 182.0 lb

## 2014-04-08 DIAGNOSIS — S76111D Strain of right quadriceps muscle, fascia and tendon, subsequent encounter: Secondary | ICD-10-CM

## 2014-04-08 NOTE — Progress Notes (Signed)
Chief Complaint  Patient presents with  . Follow-up    2 week recheck on right quad tendon repair, DOS 03-10-14.    The patient came in today to advance his brace from 30 up to 60. He has some wound issue at the top of his wound and in the middle of the wound there is a scab. When he took his last bandage off it pulled the scab off and left some of the skin pulled off with it. There is mild erythema around the area but there is no joint effusion no sign of infection  Recommend dry sterile dressings. I did suggest a triple antibiotic ointment but he is allergic to bacitracin so we aborted that  Recommend come back 2 weeks advance his brace to 90

## 2014-04-22 ENCOUNTER — Encounter: Payer: Self-pay | Admitting: Orthopedic Surgery

## 2014-04-22 ENCOUNTER — Ambulatory Visit (INDEPENDENT_AMBULATORY_CARE_PROVIDER_SITE_OTHER): Payer: Self-pay | Admitting: Orthopedic Surgery

## 2014-04-22 VITALS — BP 137/93 | Ht 63.0 in | Wt 182.0 lb

## 2014-04-22 DIAGNOSIS — S76111D Strain of right quadriceps muscle, fascia and tendon, subsequent encounter: Secondary | ICD-10-CM

## 2014-04-22 NOTE — Patient Instructions (Signed)
WBAT IN BRACE BEND KNEE IN BRACE

## 2014-04-22 NOTE — Progress Notes (Signed)
Chief Complaint  Patient presents with  . Follow-up    follow up Right quad tendon, advance brace, DOS 03/10/14    6 weeks postop right quad repair is in a hinged brace. Brace 0-60 per graph he has active quadriceps extension 30-0. Advance brace 0-90 return 2 weeks advance brace 0-120

## 2014-05-06 ENCOUNTER — Encounter: Payer: Self-pay | Admitting: Orthopedic Surgery

## 2014-05-06 ENCOUNTER — Ambulatory Visit (INDEPENDENT_AMBULATORY_CARE_PROVIDER_SITE_OTHER): Payer: Self-pay | Admitting: Orthopedic Surgery

## 2014-05-06 VITALS — BP 157/104 | Ht 63.0 in | Wt 182.0 lb

## 2014-05-06 DIAGNOSIS — S76111D Strain of right quadriceps muscle, fascia and tendon, subsequent encounter: Secondary | ICD-10-CM

## 2014-05-06 NOTE — Patient Instructions (Signed)
Call to arrange therapy at [facility] therapy dept

## 2014-05-06 NOTE — Progress Notes (Signed)
Chief Complaint  Patient presents with  . Follow-up    follow up Right quad tendon, DOS 03/10/14    Encounter Diagnosis  Name Primary?  . Rupture of right quadriceps tendon, subsequent encounter Yes     Brace Avastin 120  Patient has terminal knee extension with mild extensor lag. Eschar scab healing well  No treatment for that.  Continue ambulation start therapy follow-up 4 weeks

## 2014-05-12 ENCOUNTER — Ambulatory Visit (HOSPITAL_COMMUNITY): Payer: Medicare PPO | Attending: Orthopedic Surgery | Admitting: Physical Therapy

## 2014-05-12 DIAGNOSIS — Z9889 Other specified postprocedural states: Secondary | ICD-10-CM

## 2014-05-12 DIAGNOSIS — R262 Difficulty in walking, not elsewhere classified: Secondary | ICD-10-CM | POA: Diagnosis not present

## 2014-05-12 DIAGNOSIS — M25661 Stiffness of right knee, not elsewhere classified: Secondary | ICD-10-CM | POA: Insufficient documentation

## 2014-05-12 NOTE — Therapy (Signed)
[address] [place] [place], [name], [address] Phone: [phone]   Fax:  [phone]  Physical Therapy Evaluation  Patient Details  Name: Larry Graves MRN: [id number] Date of Birth: 21-Feb-1946 Referring Provider:  Carole Civil, MD  Encounter Date: 05/12/2014      PT End of Session - 05/12/14 1506    Visit Number 1   Number of Visits 18   Date for PT Re-Evaluation 06/11/14   Authorization Type A[employer]na Medicare   Authorization - Visit Number 1   Authorization - Number of Visits 10   PT Start Time 1430   PT Stop Time 1515   PT Time Calculation (min) 45 min   Activity Tolerance Patient tolerated treatment well   Behavior During Therapy [facility] for tasks assessed/performed      Past Medical History  Diagnosis Date  . Hypertension   . Diabetes mellitus without complication   . Heart murmur   . BPH (benign prostatic hyperplasia)   . History of alcoholism     Past Surgical History  Procedure Laterality Date  . Replacement total knee Left 2000?    [facility] in [place], [place]  . Carpal tunnel release Bilateral 1990's    [facility] in [place], [place]  . Nasal septum surgery  1980's    [facility] [place], [place]  . Foot surgery Left 2010?    bunionectomy-[facility] in [place], [place]  . Appendectomy  [facility] in [place], [place]  . Hernia repair  1953?    [facility] in [place], [place]  . Melanoma excision      on face-all done in office   . Quadriceps tendon repair Right 03/10/2014    Procedure: REPAIR QUADRICEP TENDON;  Surgeon: Carole Civil, MD;  Location: [facility];  Service: Orthopedics;  Laterality: Right;    There were no vitals taken for this visit.  Visit Diagnosis:  Knee stiffness, right  Hx of right knee surgery  Difficulty walking      Subjective Assessment - 05/12/14 1438    Pertinent History 03/10/14 Rt ratellar tendon repair following complete tear, Patient's  hobbies ionclude: camoping, fishing, hunting, working in [employer]. Patient's wound severely escarded secondary to history of a blood blister on Rt knee bursting resulting in excessive escar and scabbing. patient is retired.  no previous therapy   How long can you walk comfortably? walking with walker 1 hour   Patient Stated Goals bowling,fishing, ellyptical, hunting, to be bale to walk daily for 1 hour or greater.    Currently in Pain? Yes   Pain Score 0-No pain   Pain Location Knee   Pain Orientation Right          [facility] PT Assessment - 05/12/14 0001    Assessment   Medical Diagnosis Rt patellar tendon repair.    Onset Date 03/10/14   Next MD Visit Aline Brochure.    Prior Therapy no   Precautions   Precaution Comments Diabetes, good ciculation.    Balance Screen   Has the patient fallen in the past 6 months No   Has the patient had a decrease in activity level because of a fear of falling?  No   Is the patient reluctant to leave their home because of a fear of falling?  No   Prior Function   Level of Independence Independent with basic ADLs   Vocation Retired   [employer]  Overall Cognitive Status Within Functional Limits for tasks assessed   Functional Tests   Functional tests Sit to Stand;Other;Other2   AROM   AROM Assessment Site Hip;Knee;Ankle   Right/Left Hip Right;Left   Right Hip External Rotation  32   Right Hip Internal Rotation  16   Left Hip External Rotation  50   Left Hip Internal Rotation  18   Right/Left Knee Right;Left   Right Knee Extension -2   Right Knee Flexion 40   Left Knee Extension 0   Left Knee Flexion 90   Strength   Strength Assessment Site Hip;Knee;Ankle   Right/Left Hip Right;Left   Right Hip Flexion 4+/5   Right Hip Extension 5/5   Left Hip Flexion 5/5   Left Hip Extension 5/5   Right/Left Knee Right;Left   Right Knee Flexion 4/5   Right Knee Extension 4/5   Left Knee Flexion 5/5   Left Knee Extension 5/5            [facility]  Adult PT Treatment/Exercise - 05/12/14 0001    Exercises   Exercises Knee/Hip   Knee/Hip Exercises: Stretches   Active Hamstring Stretch 2 reps;20 seconds   Hip Flexor Stretch 2 reps;20 seconds   Gastroc Stretch 20 seconds;2 reps            PT Education - 05/12/14 1506    Education provided Yes   Education Details Diagnosis, prognosis, and Le exercises for HEp as performed this sessopn.    Person(s) Educated Patient   Methods Explanation;Demonstration;Handout   Comprehension Verbalized understanding;Returned demonstration          PT Short Term Goals - 05/12/14 1529    PT SHORT TERM GOAL #1   Title patient will demonstrate increased Rt knee flexion to 60 degrees to be able to stand from high chairs without using UEs for support.    Baseline 40 degrees Rt knee flexion   Time 2   Period Weeks   Status New   PT SHORT TERM GOAL #2   Title Patient will be able to fully extend knee to normalize stride mechanics of gait.    Time 3   Period Weeks   Status New   PT SHORT TERM GOAL #3   Title Patient will demonstrate independence with HEP.    Time 3   Period Weeks   Status New   PT SHORT TERM GOAL #4   Title Patient will dmeonstrate increased hip intenernal totion ROm to 25 degrees to improve decelration mechanics   Time 3   Period Weeks   Status New           PT Long Term Goals - 05/12/14 1526    PT LONG TERM GOAL #1   Title patient will demonstrate increased Rt knee flexion to 90 degrees to be able to stand from typical chair without using UEs for support   Time 4   PT LONG TERM GOAL #2   Title patient will demonstrate increased Rt knee flexion to >100 degrees to be able to stand from typical chair without using UEs for support   Time 6   Period Weeks   Status New   PT LONG TERM GOAL #3   Title Patient will dmeonstrate 5/5 MMT for Rt knee flexion and extension to ambulate up and down stairs withtou assistance.    Time 6   Period Weeks   Status New   PT LONG  TERM GOAL #4   Title Patient wll be  able to ambulate withtou assistive device.    Time 6   Period Weeks   Status New   PT LONG TERM GOAL #5   Title Patient will dmeonstrate increased hip intenernal rototion ROm to 35 degrees to improve decelration mechanics   Time 6   Period Weeks   Status New               Plan - 06/06/14 1508    Clinical Impression Statement Patient displays Rt knee stiffness resulting in difficulty walking and scquatting s/p Rt petellar tendon reconstruction. Specifically patient displasy severe limitaqtion in Rt knee flexion (40 degrees) resulting in inability to flex tknee to stand from low chair. Patient will benefit fom skilled physical therapy to increase Rt knee AROm so patient can return to working in garden, and performing his usual hobbies.    Pt will benefit from skilled therapeutic intervention in order to improve on the following deficits Abnormal gait;Decreased endurance;Improper body mechanics;Impaired flexibility;Decreased strength;Difficulty walking;Pain;Decreased range of motion   Rehab Potential Good   PT Frequency 3x / week   PT Duration 6 weeks   PT Next Visit Plan Progress stretches to multidirectional, introduce piriformis stretch and 3 way knee driver   PT Home Exercise Plan Calf, hamstring, hip flexor stetch with anterior knee driver    Consulted and Agree with Plan of Care Patient          G-Codes - 2014-06-06 1531    Functional Assessment Tool Used Clinical judgment 70% limited based on Rt knee flexion of 40 degrees out of 130 normal ROM   Functional Limitation Mobility: Walking and moving around   Mobility: Walking and Moving Around Current Status [phone]) At least 60 percent but less than 80 percent impaired, limited or restricted   Mobility: Walking and Moving Around Goal Status [phone]) At least 40 percent but less than 60 percent impaired, limited or restricted       Problem List Patient Active Problem List   Diagnosis Date  Noted  . Quadriceps muscle rupture    Devona Konig PT DPT [place], [name], [address] Phone: [phone]   Fax:  [phone]

## 2014-05-18 ENCOUNTER — Ambulatory Visit (HOSPITAL_COMMUNITY): Payer: Medicare HMO | Admitting: Specialist

## 2014-05-19 ENCOUNTER — Ambulatory Visit (HOSPITAL_COMMUNITY): Payer: Medicare PPO | Admitting: Physical Therapy

## 2014-05-19 DIAGNOSIS — R262 Difficulty in walking, not elsewhere classified: Secondary | ICD-10-CM

## 2014-05-19 DIAGNOSIS — Z9889 Other specified postprocedural states: Secondary | ICD-10-CM

## 2014-05-19 DIAGNOSIS — M25661 Stiffness of right knee, not elsewhere classified: Secondary | ICD-10-CM | POA: Diagnosis not present

## 2014-05-19 NOTE — Therapy (Signed)
[place], [name], [address] Phone: [phone]   Fax:  [phone]  Physical Therapy Treatment  Patient Details  Name: Larry Graves MRN: [id number] Date of Birth: Nov 09, 1945 Referring Provider:  Delphina Cahill, MD  Encounter Date: 05/19/2014      PT End of Session - 05/19/14 1700    Visit Number 2   Number of Visits 18   Date for PT Re-Evaluation 06/11/14   Authorization Type Aetna Medicare   Authorization - Visit Number 2   Authorization - Number of Visits 10   PT Start Time 7253   PT Stop Time 1645   PT Time Calculation (min) 41 min   Activity Tolerance Patient tolerated treatment well   Behavior During Therapy [facility] for tasks assessed/performed      Past Medical History  Diagnosis Date  . Hypertension   . Diabetes mellitus without complication   . Heart murmur   . BPH (benign prostatic hyperplasia)   . History of alcoholism     Past Surgical History  Procedure Laterality Date  . Replacement total knee Left 2000?    [facility] in [place], [place]  . Carpal tunnel release Bilateral 1990's    [facility] in [place], [place]  . Nasal septum surgery  1980's    [facility] [place], [place]  . Foot surgery Left 2010?    [facility]unionectomy-[facility] in [place], [place]  . Appendectomy  [facility] in [place], [place]  . Hernia repair  1953?    [facility] in [place], [place]  . Melanoma excision      on face-all done in office   . Quadriceps tendon repair Right 03/10/2014    Procedure: REPAIR QUADRICEP TENDON;  Surgeon: Carole Civil, MD;  Location: [facility];  Service: Orthopedics;  Laterality: Right;    There were no vitals taken for this visit.  Visit Diagnosis:  Knee stiffness, right  Hx of right knee surgery  Difficulty walking      Subjective Assessment - 05/19/14 1618    Symptoms Patient states that his knee is feeling better and his uninvolved side is feelign better  as well.    Currently in Pain? Yes   Pain Score 2    Pain Location Knee   Pain Orientation Right                    [facility] Adult PT Treatment/Exercise - 05/19/14 0001    Knee/Hip Exercises: Stretches   Active Hamstring Stretch 2 reps;20 seconds   Active Hamstring Stretch Limitations 3way to 12"   Hip Flexor Stretch 2 reps;20 seconds   Hip Flexor Stretch Limitations to 6"   Knee: Self-Stretch Limitations 10x 3 seconds 3 way    Gastroc Stretch 20 seconds;2 reps   Gastroc Stretch Limitations 3 way on floor   Knee/Hip Exercises: Standing   Other Standing Knee Exercises 3D hip excursions with knee brace on Rt.                   PT Short Term Goals - 05/19/14 1703    PT SHORT TERM GOAL #1   Title patient will demonstrate increased Rt knee flexion to 60 degrees to be able to stand from high chairs without using UEs for support.    Status On-going   PT SHORT TERM GOAL #2   Title Patient will be able to fully extend knee  to normalize stride mechanics of gait.    Status On-going   PT SHORT TERM GOAL #3   Title Patient will demonstrate independence with HEP.    Status On-going   PT SHORT TERM GOAL #4   Title Patient will dmeonstrate increased hip intenernal totion ROm to 25 degrees to improve decelration mechanics   Status On-going           PT Long Term Goals - 05/19/14 1704    PT LONG TERM GOAL #1   Title patient will demonstrate increased Rt knee flexion to 90 degrees to be able to stand from typical chair without using UEs for support   Status On-going   PT LONG TERM GOAL #2   Title patient will demonstrate increased Rt knee flexion to >100 degrees to be able to stand from typical chair without using UEs for support   Status On-going   PT LONG TERM GOAL #3   Title Patient will dmeonstrate 5/5 MMT for Rt knee flexion and extension to ambulate up and down stairs withtou assistance.    Status On-going   PT LONG TERM GOAL #4   Title Patient wll be able to  ambulate withtou assistive device.    Status On-going   PT LONG TERM GOAL #5   Title Patient will dmeonstrate increased hip intenernal rototion ROm to 35 degrees to improve deceleration mechanics   Status On-going               Plan - 05/19/14 1701    Clinical Impression Statement Patient displasy already improvign ROm win Rt knee with improved ability to flex Rt knee and strighten it during gait resulting in patient able to ambualte without walker. Patient demosntrates improved gait with decreased requirement on UE support as patient can now ambulate with a cain.    PT Next Visit Plan decreaase repetitions of stretches, introduce piriformis stretch and prone quadriceps stretch, then introduce walking hip excursions with brace.         Problem List Patient Active Problem List   Diagnosis Date Noted  . Quadriceps muscle rupture    Devona Konig PT DPT [place], [name], [address] Phone: [phone]   Fax:  [phone]

## 2014-05-21 ENCOUNTER — Ambulatory Visit (HOSPITAL_COMMUNITY): Payer: Medicare PPO

## 2014-05-21 DIAGNOSIS — M25661 Stiffness of right knee, not elsewhere classified: Secondary | ICD-10-CM | POA: Diagnosis not present

## 2014-05-21 DIAGNOSIS — R262 Difficulty in walking, not elsewhere classified: Secondary | ICD-10-CM

## 2014-05-21 DIAGNOSIS — Z9889 Other specified postprocedural states: Secondary | ICD-10-CM

## 2014-05-21 NOTE — Therapy (Signed)
[place], [name], [address] Phone: [phone]   Fax:  [phone]  Physical Therapy Treatment  Patient Details  Name: Larry Graves MRN: [id number] Date of Birth: 12-10-1945 Referring Provider:  Delphina Cahill, MD  Encounter Date: 05/21/2014      PT End of Session - 05/21/14 1655    Visit Number 3   Number of Visits 18   Date for PT Re-Evaluation 06/11/14   Authorization Type Aetna Medicare   Authorization - Visit Number 3   Authorization - Number of Visits 10   PT Start Time 0998   PT Stop Time 1645   PT Time Calculation (min) 39 min   Activity Tolerance Patient tolerated treatment well   Behavior During Therapy [facility] for tasks assessed/performed      Past Medical History  Diagnosis Date  . Hypertension   . Diabetes mellitus without complication   . Heart murmur   . BPH (benign prostatic hyperplasia)   . History of alcoholism     Past Surgical History  Procedure Laterality Date  . Replacement total knee Left 2000?    [facility] in [place], [place]  . Carpal tunnel release Bilateral 1990's    [facility] in [place], [place]  . Nasal septum surgery  1980's    [facility] [place], [place]  . Foot surgery Left 2010?    [facility]ectomy-[facility] in [place], [place]  . Appendectomy  [facility] in [place], [place]  . Hernia repair  1953?    [facility] in [place], [place]  . Melanoma excision      on face-all done in office   . Quadriceps tendon repair Right 03/10/2014    Procedure: REPAIR QUADRICEP TENDON;  Surgeon: Carole Civil, MD;  Location: [facility];  Service: Orthopedics;  Laterality: Right;    There were no vitals filed for this visit.  Visit Diagnosis:  Knee stiffness, right  Hx of right knee surgery  Difficulty walking      Subjective Assessment - 05/21/14 1610    Symptoms Pain free right now.  Compliant with exercises had a question about brace on or off with  gastroc stretches   Currently in Pain? No/denies            [facility] PT Assessment - 05/21/14 0001    Assessment   Medical Diagnosis Rt patellar tendon repair.    Onset Date 03/10/14   Next MD Visit Aline Brochure 06/03/2014.    Prior Therapy no   Precautions   Precaution Comments Diabetes, good ciculation.                    [place] [facility] Adult PT Treatment/Exercise - 05/21/14 0001    Exercises   Exercises Knee/Hip   Knee/Hip Exercises: Stretches   Active Hamstring Stretch 2 reps;20 seconds   Active Hamstring Stretch Limitations 3way to 12"   Quad Stretch 3 reps;30 seconds   Hip Flexor Stretch 2 reps;20 seconds   Hip Flexor Stretch Limitations to 12"; Groin step 2x 20   Knee: Self-Stretch Limitations 10x 3 seconds 3 way    Piriformis Stretch 3 reps;30 seconds   Piriformis Stretch Limitations manual   Gastroc Stretch 2 reps;20 seconds   Gastroc Stretch Limitations slant board   Knee/Hip Exercises: Standing   Gait Training Hip excursion walking 2RT   Other Standing Knee Exercises 3D hip excursions with knee brace on Rt split stance  with Rt behind for increased weight bearing                  PT Short Term Goals - 05/21/14 1710    PT SHORT TERM GOAL #1   Title patient will demonstrate increased Rt knee flexion to 60 degrees to be able to stand from high chairs without using UEs for support.    Status On-going   PT SHORT TERM GOAL #2   Title Patient will be able to fully extend knee to normalize stride mechanics of gait.    Status On-going   PT SHORT TERM GOAL #3   Title Patient will demonstrate independence with HEP.    Baseline Reports daily   Status Achieved   PT SHORT TERM GOAL #4   Title Patient will dmeonstrate increased hip intenernal totion ROm to 25 degrees to improve decelration mechanics   Status On-going           PT Long Term Goals - 05/21/14 1711    PT LONG TERM GOAL #1   Title patient will demonstrate increased Rt knee flexion to 90 degrees  to be able to stand from typical chair without using UEs for support   PT LONG TERM GOAL #2   Title patient will demonstrate increased Rt knee flexion to >100 degrees to be able to stand from typical chair without using UEs for support   PT LONG TERM GOAL #3   Title Patient will dmeonstrate 5/5 MMT for Rt knee flexion and extension to ambulate up and down stairs withtou assistance.    PT LONG TERM GOAL #4   Title Patient wll be able to ambulate withtou assistive device.    PT LONG TERM GOAL #5   Title Patient will dmeonstrate increased hip intenernal rototion ROm to 35 degrees to improve deceleration mechanics               Plan - 05/21/14 1656    Clinical Impression Statement Began piriformis and quad stretches with manual assistance required due to tight hip musculatyre.  Added walking hip excursion to improve hip mobilty with cueing for techniques with brace on.  Stretches were complete without brace on.  No reports of pain through sessoin.   PT Next Visit Plan Continues with stretches to improve flexibility and excursion walking wiht brace on to improve hip mobility.        Problem List Patient Active Problem List   Diagnosis Date Noted  . Quadriceps muscle rupture    Ihor Austin, [place]  Aldona Lento 05/21/2014, 5:13 PM  [place] [address] [place], [name], [address] Phone: [phone]   Fax:  [phone]

## 2014-05-25 ENCOUNTER — Ambulatory Visit (HOSPITAL_COMMUNITY): Payer: Medicare PPO

## 2014-05-25 DIAGNOSIS — Z9889 Other specified postprocedural states: Secondary | ICD-10-CM

## 2014-05-25 DIAGNOSIS — M25661 Stiffness of right knee, not elsewhere classified: Secondary | ICD-10-CM | POA: Diagnosis not present

## 2014-05-25 DIAGNOSIS — R262 Difficulty in walking, not elsewhere classified: Secondary | ICD-10-CM

## 2014-05-25 NOTE — Therapy (Signed)
[place], [name], [address] Phone: [phone]   Fax:  [phone]  Physical Therapy Treatment  Patient Details  Name: Larry Graves MRN: [id number] Date of Birth: [birth year] Referring Provider:  Delphina Cahill, MD  Encounter Date: 05/25/2014      PT End of Session - 05/25/14 1620    Visit Number 4   Number of Visits 18   Date for PT Re-Evaluation 06/11/14   Authorization Type Aetna Medicare   Authorization - Visit Number 4   Authorization - Number of Visits 10   PT Start Time 0258   PT Stop Time 1645   PT Time Calculation (min) 53 min   Activity Tolerance Patient tolerated treatment well   Behavior During Therapy [facility] for tasks assessed/performed      Past Medical History  Diagnosis Date  . Hypertension   . Diabetes mellitus without complication   . Heart murmur   . BPH (benign prostatic hyperplasia)   . History of alcoholism     Past Surgical History  Procedure Laterality Date  . Replacement total knee Left 2000?    [facility] in [place], [place]  . Carpal tunnel release Bilateral 1990's    [facility] in [place], [place]  . Nasal septum surgery  1980's    [facility] in [place], [place]  . Foot surgery Left 2010?    [facility] in [place], [place]  . Appendectomy  [facility] in [place], [place]  . Hernia repair  1953?    [facility] in [place], [place]  . Melanoma excision      on face-all done in office   . Quadriceps tendon repair Right 03/10/2014    Procedure: REPAIR QUADRICEP TENDON;  Surgeon: Larry Civil, MD;  Location: [facility];  Service: Orthopedics;  Laterality: Right;    There were no vitals filed for this visit.  Visit Diagnosis:  Knee stiffness, right  Hx of right knee surgery  Difficulty walking      Subjective Assessment - 05/25/14 1607    Symptoms Pt stated he was a liffle sore following last session, no reports of pain through  session.   Currently in Pain? No/denies                       [facility] Adult PT Treatment/Exercise - 05/25/14 0001    Exercises   Exercises Knee/Hip   Knee/Hip Exercises: Stretches   Active Hamstring Stretch 3 reps;30 seconds   Active Hamstring Stretch Limitations 3way to 12"   Quad Stretch 3 reps;30 seconds   Piriformis Stretch 3 reps;30 seconds   Piriformis Stretch Limitations manual   Gastroc Stretch 3 reps;30 seconds   Gastroc Stretch Limitations slant board   Knee/Hip Exercises: Standing   Rocker Board 2 minutes   Rocker Board Limitations R/L and A/P   Gait Training Hip excursion walking 2RT   Other Standing Knee Exercises 3D hip excursions with knee brace on Rt split stance with Rt behind for increased weight bearing                PT Education - 05/25/14 1657    Education provided Yes   Education Details Wife instructed piriformis and quad stretches; Education on proper alighment with brace   Person(s) Educated Patient;Spouse   Methods Explanation;Demonstration;Verbal cues   Comprehension Verbalized understanding;Returned demonstration          PT  Short Term Goals - 05/25/14 1656    PT SHORT TERM GOAL #1   Title patient will demonstrate increased Rt knee flexion to 60 degrees to be able to stand from high chairs without using UEs for support.    Baseline 3/15/216 78 degrees flexion   Status Partially Met   PT SHORT TERM GOAL #2   Title Patient will be able to fully extend knee to normalize stride mechanics of gait.    Status On-going   PT SHORT TERM GOAL #3   Title Patient will demonstrate independence with HEP.    Status Achieved   PT SHORT TERM GOAL #4   Title Patient will dmeonstrate increased hip intenernal totion ROm to 25 degrees to improve decelration mechanics   Status On-going           PT Long Term Goals - 05/25/14 1657    PT LONG TERM GOAL #1   Title patient will demonstrate increased Rt knee flexion to 90 degrees to be  able to stand from typical chair without using UEs for support   Status On-going   PT LONG TERM GOAL #2   Title patient will demonstrate increased Rt knee flexion to >100 degrees to be able to stand from typical chair without using UEs for support   Status On-going   PT LONG TERM GOAL #3   Title Patient will dmeonstrate 5/5 MMT for Rt knee flexion and extension to ambulate up and down stairs withtou assistance.    PT LONG TERM GOAL #4   Title Patient wll be able to ambulate withtou assistive device.    PT LONG TERM GOAL #5   Title Patient will dmeonstrate increased hip intenernal rototion ROm to 35 degrees to improve deceleration mechanics               Plan - 05/25/14 1620    Clinical Impression Statement Adjusted and instructed proper alignment for knee brace with increased ease for knee flexion with activities.  Added rockerboard to improve weight distribution with gait.  Continued split stance with Rt foot behind for increased weight bearing with therapist facilitation to improve form.  Pt continues to exhibit tight hip musculature.  Pt's wife instructed manual stretches for piriformis and quad to improve AROM.   PT Next Visit Plan Continues with stretches to improve flexibility and excursion walking wiht brace on to improve hip mobility.        Problem List Patient Active Problem List   Diagnosis Date Noted  . Quadriceps muscle rupture    Larry [place], [place]  Aldona Lento 05/25/2014, 5:04 PM  [facility] [address] [place], [name], [address] Phone: [phone]   Fax:  [phone]

## 2014-05-26 ENCOUNTER — Ambulatory Visit (HOSPITAL_COMMUNITY): Payer: Medicare PPO

## 2014-05-26 DIAGNOSIS — Z9889 Other specified postprocedural states: Secondary | ICD-10-CM

## 2014-05-26 DIAGNOSIS — M25661 Stiffness of right knee, not elsewhere classified: Secondary | ICD-10-CM | POA: Diagnosis not present

## 2014-05-26 DIAGNOSIS — R262 Difficulty in walking, not elsewhere classified: Secondary | ICD-10-CM

## 2014-05-26 NOTE — Patient Instructions (Signed)
Hip Extension   Lie on back, legs in air, knees bent. Grasp hands behind one thigh and cross other leg over same thigh. Hold 30  seconds. Repeat with other leg held. Repeat 3  times. Do 2 sessions per day.  Copyright  VHI. All rights reserved.   Internal Rotation / Flexion: Stretch - Piriformis (Supine)   Position (A) Patient: Keep trunk steady. Helper: Support left leg with both hands. Motion (B) - Helper lifts leg, bending hip and knee. - Move knee toward opposite shoulder. - Patient should feel stretch in buttock. Hold 30 seconds. Repeat 3  times. Repeat with other leg. Do 2 sessions per day.  Copyright  VHI. All rights reserved.

## 2014-05-26 NOTE — Therapy (Signed)
[place], [name], [address] Phone: [phone]   Fax:  [phone]  Physical Therapy Treatment  Patient Details  Name: Larry Graves MRN: [id number] Date of Birth: 1945/07/09 Referring Provider:  Delphina Cahill, MD  Encounter Date: 05/26/2014      PT End of Session - 05/26/14 1545    Visit Number 5   Number of Visits 18   Date for PT Re-Evaluation 06/11/14   Authorization Type A[employer] Medicare   Authorization - Visit Number 5   Authorization - Number of Visits 10   PT Start Time 1518   PT Stop Time 1612   PT Time Calculation (min) 54 min   Activity Tolerance Patient tolerated treatment well   Behavior During Therapy [facility] for tasks assessed/performed      Past Medical History  Diagnosis Date  . Hypertension   . Diabetes mellitus without complication   . Heart murmur   . BPH (benign prostatic hyperplasia)   . History of alcoholism     Past Surgical History  Procedure Laterality Date  . Replacement total knee Left 2000?    [facility] in [place], [place]  . Carpal tunnel release Bilateral 1990's    [facility] in [place], [place]  . Nasal septum surgery  1980's    [facility] [place], [place]  . Foot surgery Left 2010?    bunionectomy-[facility] in [place], [place]  . Appendectomy  [place] in [place], [place]  . Hernia repair  1953?    [facility] in [place], [place]  . Melanoma excision      on face-all done in office   . Quadriceps tendon repair Right 03/10/2014    Procedure: REPAIR QUADRICEP TENDON;  Surgeon: Carole Civil, MD;  Location: [facility];  Service: Orthopedics;  Laterality: Right;    There were no vitals filed for this visit.  Visit Diagnosis:  Knee stiffness, right  Hx of right knee surgery  Difficulty walking      Subjective Assessment - 05/26/14 1541    Symptoms Pt stated no pain today, knee is just stiff. Reported wife would like to review  stretches instructed yesterday   Currently in Pain? No/denies            [facility] PT Assessment - 05/26/14 0001    Assessment   Medical Diagnosis Rt patellar tendon repair.    Onset Date 03/10/14   Next MD Visit Aline Brochure 06/03/2014.    Prior Therapy no   Precautions   Precaution Comments Diabetes, good ciculation.                    [facility] Adult PT Treatment/Exercise - 05/26/14 1544    Exercises   Exercises Knee/Hip   Knee/Hip Exercises: Stretches   Active Hamstring Stretch 3 reps;30 seconds   Active Hamstring Stretch Limitations 3way to 14"   Quad Stretch 3 reps;30 seconds   Quad Stretch Limitations prone with rope   Knee: Self-Stretch Limitations knee drives 3 directions 23R 3" on 6 in step   ITB Stretch 3 reps;30 seconds   ITB Stretch Limitations beside 6in step   Piriformis Stretch 3 reps;30 seconds   Piriformis Stretch Limitations manual   Gastroc Stretch 3 reps;30 seconds   Gastroc Stretch Limitations slant board   Knee/Hip Exercises: Standing   Rocker Board 2 minutes   Rocker Board Limitations R/L and A/P   Personnel officer  Hip excursion walking 2RT   Other Standing Knee Exercises 3D hip excursions with knee brace on Rt split stance with Rt behind for increased weight bearing                PT Education - 05/26/14 1616    Education provided Yes   Education Details Reviewed stretches with pt and wife for correct form, HEP printout given   Person(s) Educated Patient;Spouse   Methods Explanation;Demonstration;Handout   Comprehension Verbalized understanding;Returned demonstration          PT Short Term Goals - 05/26/14 1614    PT SHORT TERM GOAL #1   Title patient will demonstrate increased Rt knee flexion to 60 degrees to be able to stand from high chairs without using UEs for support.    PT SHORT TERM GOAL #2   Title Patient will be able to fully extend knee to normalize stride mechanics of gait.    PT SHORT TERM GOAL #3   Title Patient will  demonstrate independence with HEP.    Status Achieved   PT SHORT TERM GOAL #4   Title Patient will dmeonstrate increased hip intenernal totion ROm to 25 degrees to improve decelration mechanics   Status On-going           PT Long Term Goals - 05/26/14 1615    PT LONG TERM GOAL #1   Title patient will demonstrate increased Rt knee flexion to 90 degrees to be able to stand from typical chair without using UEs for support   PT LONG TERM GOAL #2   Title patient will demonstrate increased Rt knee flexion to >100 degrees to be able to stand from typical chair without using UEs for support   PT LONG TERM GOAL #3   Title Patient will dmeonstrate 5/5 MMT for Rt knee flexion and extension to ambulate up and down stairs withtou assistance.    PT LONG TERM GOAL #4   Title Patient wll be able to ambulate withtou assistive device.    PT LONG TERM GOAL #5   Title Patient will dmeonstrate increased hip intenernal rototion ROm to 35 degrees to improve deceleration mechanics               Plan - 05/26/14 1546    Clinical Impression Statement Pt reported knee brace feels better with increased ability to bend knee during gait following adjustment last session.  Continued session focus on improving hip mobility and stretches for flexibilty.  Reviewed quad and piriformis stretch with pt and wife to assure correct form and technique, able to perform with minimal difficulty, given HEP printout.  Pt with improved stance phase with gait and hip mobilty is progressing.  No reports of pain through session.     PT Next Visit Plan Continues with stretches to improve flexibility and excursion walking with brace on to improve hip mobility.  Next session begin sit to stands without HHA from lowest height able and progress quad strenghtening for knee extension, continue progressing flexion as able.          Problem List Patient Active Problem List   Diagnosis Date Noted  . Quadriceps muscle rupture     Ihor Austin, [place]  Aldona Lento 05/26/2014, 4:21 PM  [facility] [address] [place], [name], [address] Phone: [phone]   Fax:  [phone]

## 2014-05-27 ENCOUNTER — Ambulatory Visit (HOSPITAL_COMMUNITY): Payer: Medicare PPO

## 2014-05-27 DIAGNOSIS — Z9889 Other specified postprocedural states: Secondary | ICD-10-CM

## 2014-05-27 DIAGNOSIS — R262 Difficulty in walking, not elsewhere classified: Secondary | ICD-10-CM

## 2014-05-27 DIAGNOSIS — M25661 Stiffness of right knee, not elsewhere classified: Secondary | ICD-10-CM

## 2014-05-27 NOTE — Therapy (Signed)
[name], [name], [address] Phone: [phone]   Fax:  [phone]  Physical Therapy Treatment  Patient Details  Name: Larry Graves MRN: [id number] Date of Birth: 1946/02/23 Referring Provider:  Delphina Cahill, MD  Encounter Date: 05/27/2014      PT End of Session - 05/27/14 1351    Visit Number 6   Number of Visits 18   Date for PT Re-Evaluation 06/11/14   Authorization Type [employer]na Medicare   Authorization - Visit Number 6   Authorization - Number of Visits 10   PT Start Time 7672   PT Stop Time 1433   PT Time Calculation (min) 51 min   Activity Tolerance Patient tolerated treatment well   Behavior During Therapy [facility] for tasks assessed/performed      Past Medical History  Diagnosis Date  . Hypertension   . Diabetes mellitus without complication   . Heart murmur   . BPH (benign prostatic hyperplasia)   . History of alcoholism     Past Surgical History  Procedure Laterality Date  . Replacement total knee Left 2000?    [facility] in [place], [place]  . Carpal tunnel release Bilateral 1990's    [facility] in [place], [place]  . Nasal septum surgery  1980's    [facility] [place], [place]  . Foot surgery Left 2010?    bunionectomy-[facility] in [place], [place]  . Appendectomy  [place] in [place], [place]  . Hernia repair  1953?    [facility] in [place], [place]  . Melanoma excision      on face-all done in office   . Quadriceps tendon repair Right 03/10/2014    Procedure: REPAIR QUADRICEP TENDON;  Surgeon: Carole Civil, MD;  Location: [facility];  Service: Orthopedics;  Laterality: Right;    There were no vitals filed for this visit.  Visit Diagnosis:  Knee stiffness, right  Hx of right knee surgery  Difficulty walking      Subjective Assessment - 05/27/14 1345    Symptoms Feeling  good today, mainly stiff  Knee was a little sore last night but helped  following stretches.   Currently in Pain? No/denies   Pain Descriptors / Indicators Sore            [facility] PT Assessment - 05/27/14 0001    Assessment   Medical Diagnosis Rt patellar tendon repair.    Onset Date 03/10/14   Next MD Visit Aline Brochure 06/03/2014.    Prior Therapy no   Precautions   Precaution Comments Diabetes, good ciculation.    AROM   Right Knee Extension -3   Right Knee Flexion 82                   [facility] Adult PT Treatment/Exercise - 05/27/14 0001    Exercises   Exercises Knee/Hip   Knee/Hip Exercises: Stretches   Active Hamstring Stretch 3 reps;30 seconds   Active Hamstring Stretch Limitations 3way to 14"   Quad Stretch 3 reps;30 seconds   Quad Stretch Limitations prone with rope   Knee: Self-Stretch Limitations knee drives 3 directions 09O 3" on 6 in step   ITB Stretch 3 reps;30 seconds   ITB Stretch Limitations beside 6in step   Piriformis Stretch 3 reps;30 seconds   Piriformis Stretch Limitations manual   Gastroc Stretch 3 reps;30 seconds   Gastroc Stretch Limitations slant board  Knee/Hip Exercises: Aerobic   Stationary Bike Rocking for ROM seat 11 8 min   Knee/Hip Exercises: Standing   Other Standing Knee Exercises 3D hip excursions with knee brace on Rt split stance with Rt behind for increased weight bearing   Other Standing Knee Exercises 10 STS no HHA from 22 in step   Knee/Hip Exercises: Supine   Short Arc Quad Sets 15 reps   Heel Slides 5 reps   Heel Slides Limitations 4-82 degrees.                PT Education - 05/26/14 1616    Education provided Yes   Education Details Reviewed stretches with pt and wife for correct form, HEP printout given   Person(s) Educated Patient;Spouse   Methods Explanation;Demonstration;Handout   Comprehension Verbalized understanding;Returned demonstration          PT Short Term Goals - 05/27/14 1352    PT SHORT TERM GOAL #1   Title patient will demonstrate increased Rt knee flexion  to 60 degrees to be able to stand from high chairs without using UEs for support.    Baseline 05/27/2014 82 degrees flexion, able to STS from 22in chair without HHA   Status Achieved   PT SHORT TERM GOAL #2   Title Patient will be able to fully extend knee to normalize stride mechanics of gait.    Baseline 05/27/2014 lacking 3 degrees extension   Status On-going   PT SHORT TERM GOAL #3   Title Patient will demonstrate independence with HEP.    Status Achieved   PT SHORT TERM GOAL #4   Title Patient will dmeonstrate increased hip intenernal totion ROM to 25 degrees to improve decelration mechanics   Status On-going           PT Long Term Goals - 05/27/14 1405    PT LONG TERM GOAL #1   Title patient will demonstrate increased Rt knee flexion to 90 degrees to be able to stand from typical chair without using UEs for support   PT LONG TERM GOAL #2   Title patient will demonstrate increased Rt knee flexion to >100 degrees to be able to stand from typical chair without using UEs for support   PT LONG TERM GOAL #3   Title Patient will dmeonstrate 5/5 MMT for Rt knee flexion and extension to ambulate up and down stairs withtou assistance.    Status On-going   PT LONG TERM GOAL #4   Title Patient wll be able to ambulate withtou assistive device.    PT LONG TERM GOAL #5   Title Patient will dmeonstrate increased hip intenernal rototion ROm to 35 degrees to improve deceleration mechanics               Plan - 05/27/14 1431    Clinical Impression Statement Began rocking on reciprocal bicycle and continued stretches to improve AROM. ROM is progressing with AROM 3-82 degrees.  Added sit to stands with ability to complete without HHA from 22in chair.  Added SAQ for quadricep activation strengthening to improve knee extension.  No reports of pain through session and pt stated confidence with form and technique with HEP stretches/   PT Next Visit Plan Continues with stretches to improve  flexibility and excursion walking with brace on to improve hip mobility. Continue quad strengthening for knee extension and progress flexion as able.          Problem List Patient Active Problem List   Diagnosis Date Noted  .  Quadriceps muscle rupture    Ihor Austin, [place]  Aldona Lento 05/27/2014, 3:03 PM  [facility] [address] [place], [name], [address] Phone: [phone]   Fax:  [phone]

## 2014-05-28 ENCOUNTER — Encounter (HOSPITAL_COMMUNITY): Payer: Medicare HMO

## 2014-06-01 ENCOUNTER — Ambulatory Visit (HOSPITAL_COMMUNITY): Payer: Medicare PPO

## 2014-06-01 DIAGNOSIS — M25661 Stiffness of right knee, not elsewhere classified: Secondary | ICD-10-CM | POA: Diagnosis not present

## 2014-06-01 DIAGNOSIS — Z9889 Other specified postprocedural states: Secondary | ICD-10-CM

## 2014-06-01 NOTE — Therapy (Signed)
[place], [name], [address] Phone: [phone]   Fax:  [phone]  Physical Therapy Treatment  Patient Details  Name: Larry Graves MRN: [id number] Date of Birth: May 05, 1945 Referring Provider:  Carole Civil, MD  Encounter Date: 06/01/2014      PT End of Session - 06/01/14 1635    Visit Number 7   Number of Visits 18   Date for PT Re-Evaluation 06/11/14   Authorization Type [employer]na Medicare   Authorization - Visit Number 7   Authorization - Number of Visits 10   PT Start Time 1600   PT Stop Time 8338   PT Time Calculation (min) 53 min   Activity Tolerance Patient tolerated treatment well   Behavior During Therapy [facility] for tasks assessed/performed      Past Medical History  Diagnosis Date  . Hypertension   . Diabetes mellitus without complication   . Heart murmur   . BPH (benign prostatic hyperplasia)   . History of alcoholism     Past Surgical History  Procedure Laterality Date  . Replacement total knee Left 2000?    [facility] in [place], [place]  . Carpal tunnel release Bilateral 1990's    [facility] in [place], [place]  . Nasal septum surgery  1980's    [facility] [place], [place]  . Foot surgery Left 2010?    bunionectomy-[facility] in [place], [place]  . Appendectomy  [place] in [place], [place]  . Hernia repair  1953?    [facility] in [place], [place]  . Melanoma excision      on face-all done in office   . Quadriceps tendon repair Right 03/10/2014    Procedure: REPAIR QUADRICEP TENDON;  Surgeon: Carole Civil, MD;  Location: [facility];  Service: Orthopedics;  Laterality: Right;    There were no vitals filed for this visit.  Visit Diagnosis:  Knee stiffness, right  Hx of right knee surgery      Subjective Assessment - 06/01/14 1611    Symptoms Feeling good, stated wife feels comfortable assisting with stretches at home.  No reports of pain  today.   Currently in Pain? No/denies            [facility] PT Assessment - 06/01/14 0001    Assessment   Medical Diagnosis Rt patellar tendon repair.    Onset Date 03/10/14   Next MD Visit Aline Brochure 06/02/2014.    Prior Therapy no   Precautions   Precaution Comments Diabetes, good ciculation.    AROM   Right Hip External Rotation  38  was 32   Right Hip Internal Rotation  25  was 16   Left Hip Internal Rotation  34  was 18   Right Knee Extension 0  was 4   Right Knee Flexion 86  was 82   Left Knee Extension 0   Left Knee Flexion 94                   [facility] Adult PT Treatment/Exercise - 06/01/14 0001    Exercises   Exercises Knee/Hip   Knee/Hip Exercises: Stretches   Active Hamstring Stretch 3 reps;30 seconds   Active Hamstring Stretch Limitations 3way to 14"   Quad Stretch 3 reps;30 seconds   Quad Stretch Limitations prone with rope   Knee: Self-Stretch Limitations knee drives 3 directions 25K 3" on 6 in step  ITB Stretch 3 reps;30 seconds   ITB Stretch Limitations beside 6in step   Piriformis Stretch 3 reps;30 seconds   Piriformis Stretch Limitations manual   Gastroc Stretch 3 reps;30 seconds   Gastroc Stretch Limitations slant board   Knee/Hip Exercises: Aerobic   Stationary Bike Rocking for ROM seat 11 8 min   Knee/Hip Exercises: Standing   Gait Training Hip excursion walking 2RT   Other Standing Knee Exercises 3D hip excursions with knee brace on Rt split stance with Rt behind for increased weight bearing   Knee/Hip Exercises: Supine   Heel Slides 10 reps   Heel Slides Limitations 0-86   Knee/Hip Exercises: Prone   Other Prone Exercises TKE 10 x5"                  PT Short Term Goals - 06/01/14 1649    PT SHORT TERM GOAL #1   Title patient will demonstrate increased Rt knee flexion to 60 degrees to be able to stand from high chairs without using UEs for support.    Baseline 05/27/2014 82 degrees flexion, able to STS from 22in chair  without HHA   Status Achieved   PT SHORT TERM GOAL #2   Title Patient will be able to fully extend knee to normalize stride mechanics of gait.    Baseline 06/01/2014 0-86 degrees   Status Partially Met   PT SHORT TERM GOAL #3   Title Patient will demonstrate independence with HEP.    Baseline Reports daily   Status Achieved   PT SHORT TERM GOAL #4   Title Patient will dmeonstrate increased hip intenernal totion ROM to 25 degrees to improve decelration mechanics   Status Achieved           PT Long Term Goals - 06/01/14 1723    PT LONG TERM GOAL #1   Title patient will demonstrate increased Rt knee flexion to 90 degrees to be able to stand from typical chair without using UEs for support   Status On-going   PT LONG TERM GOAL #2   Title patient will demonstrate increased Rt knee flexion to >100 degrees to be able to stand from typical chair without using UEs for support   Status On-going   PT LONG TERM GOAL #3   Title Patient will dmeonstrate 5/5 MMT for Rt knee flexion and extension to ambulate up and down stairs withtou assistance.    Status On-going   PT LONG TERM GOAL #4   Title Patient wll be able to ambulate withtou assistive device.    PT LONG TERM GOAL #5   Title Patient will dmeonstrate increased hip intenernal rototion ROm to 35 degrees to improve deceleration mechanics               Plan - 06/01/14 1636    Clinical Impression Statement Conitnued session focus on improving knee ROM and hip mobillity with min cueing.  Added prone TKE exercise to improve extension with noted drainage on sheet following exercise.  Pt encouraged to tell MD, apt scheduled for tomorrow.  AROM is improving with knee 0-86 degrees, Pt continues to display tight hip mobilty.     PT Next Visit Plan Continues with stretches to improve flexibility and excursion walking with brace on to improve hip mobility. Continue quad strengthening for knee extension and progress flexion as able.           Problem List Patient Active Problem List   Diagnosis Date Noted  . Quadriceps muscle  rupture    Ihor Austin, [place]   Aldona Lento 06/01/2014, 5:29 PM  [facility] [address] [place], [name], [address] Phone: [phone]   Fax:  [phone]

## 2014-06-02 ENCOUNTER — Ambulatory Visit (HOSPITAL_COMMUNITY): Payer: Medicare PPO | Admitting: Physical Therapy

## 2014-06-02 ENCOUNTER — Encounter: Payer: Self-pay | Admitting: Orthopedic Surgery

## 2014-06-02 ENCOUNTER — Ambulatory Visit (INDEPENDENT_AMBULATORY_CARE_PROVIDER_SITE_OTHER): Payer: Self-pay | Admitting: Orthopedic Surgery

## 2014-06-02 VITALS — BP 173/98 | Ht 63.0 in | Wt 182.0 lb

## 2014-06-02 DIAGNOSIS — S76111D Strain of right quadriceps muscle, fascia and tendon, subsequent encounter: Secondary | ICD-10-CM

## 2014-06-02 DIAGNOSIS — Z9889 Other specified postprocedural states: Secondary | ICD-10-CM

## 2014-06-02 DIAGNOSIS — M25661 Stiffness of right knee, not elsewhere classified: Secondary | ICD-10-CM | POA: Diagnosis not present

## 2014-06-02 DIAGNOSIS — R262 Difficulty in walking, not elsewhere classified: Secondary | ICD-10-CM

## 2014-06-02 NOTE — Therapy (Addendum)
[place], [name], [address] Phone: [phone]   Fax:  [phone]  Physical Therapy Treatment  Patient Details  Name: Larry Graves MRN: [id number] Date of Birth: [birth year] Referring Provider:  Delphina Cahill, MD  Encounter Date: 06/02/2014      PT End of Session - 06/02/14 1616    Visit Number 8   Number of Visits 18   Date for PT Re-Evaluation 06/11/14   Authorization Type [employer]na Medicare   Authorization - Visit Number 8   Authorization - Number of Visits 10   PT Start Time 1520   PT Stop Time 1600   PT Time Calculation (min) 40 min   Activity Tolerance Patient tolerated treatment well   Behavior During Therapy [facility] for tasks assessed/performed      Past Medical History  Diagnosis Date  . Hypertension   . Diabetes mellitus without complication   . Heart murmur   . BPH (benign prostatic hyperplasia)   . History of alcoholism     Past Surgical History  Procedure Laterality Date  . Replacement total knee Left 2000?    [facility] in [place], [place]  . Carpal tunnel release Bilateral 1990's    [facility] in [place], [place]  . Nasal septum surgery  1980's    [facility] [place], [place]  . Foot surgery Left 2010?    [facility]ectomy-[facility] in [place], [place]  . Appendectomy  [place] in [place], [place]  . Hernia repair  1953?    [facility] in [place], [place]  . Melanoma excision      on face-all done in office   . Quadriceps tendon repair Right 03/10/2014    Procedure: REPAIR QUADRICEP TENDON;  Surgeon: Carole Civil, MD;  Location: [facility];  Service: Orthopedics;  Laterality: Right;    There were no vitals filed for this visit.  Visit Diagnosis:  Knee stiffness, right  Hx of right knee surgery  Difficulty walking      Subjective Assessment - 06/02/14 1524    Symptoms Patient statres seeing MD today who instructed patient in absolutely no twisting of Rt  knee without brace on, and to continue to leave Rt knee wound open and not covered despite it being black and draining.    Currently in Pain? No/denies   Pain Score 1    Pain Location Knee                       [facility] Adult PT Treatment/Exercise - 06/02/14 0001    Knee/Hip Exercises: Stretches   Active Hamstring Stretch 3 reps;30 seconds   Active Hamstring Stretch Limitations 3way to 14"   Quad Stretch 3 reps;30 seconds   Quad Stretch Limitations prone with rope   Knee: Self-Stretch Limitations supine heel slide 20x    Gastroc Stretch 3 reps;30 seconds   Gastroc Stretch Limitations slant board   Knee/Hip Exercises: Standing   Terminal Knee Extension 20 reps;Theraband   Theraband Level (Terminal Knee Extension) Level 4 (Blue)   Terminal Knee Extension Limitations 2 sets   Knee/Hip Exercises: Seated   Long Arc Quad 20 reps;Right;AROM   Other Seated Knee Exercises hamstring curl 20x   Knee/Hip Exercises: Supine   Heel Slides 20 reps   Heel Slides Limitations 0-84     Cybex machine: hamstring curl 30lb 2x 10 Cybex machine: Quadriceps extension 30lb 2x 10  PT Short Term Goals - 06/01/14 1649    PT SHORT TERM GOAL #1   Title patient will demonstrate increased Rt knee flexion to 60 degrees to be able to stand from high chairs without using UEs for support.    Baseline 05/27/2014 82 degrees flexion, able to STS from 22in chair without HHA   Status Achieved   PT SHORT TERM GOAL #2   Title Patient will be able to fully extend knee to normalize stride mechanics of gait.    Baseline 06/01/2014 0-86 degrees   Status Partially Met   PT SHORT TERM GOAL #3   Title Patient will demonstrate independence with HEP.    Baseline Reports daily   Status Achieved   PT SHORT TERM GOAL #4   Title Patient will dmeonstrate increased hip intenernal totion ROM to 25 degrees to improve decelration mechanics   Status Achieved           PT Long Term Goals -  06/01/14 1723    PT LONG TERM GOAL #1   Title patient will demonstrate increased Rt knee flexion to 90 degrees to be able to stand from typical chair without using UEs for support   Status On-going   PT LONG TERM GOAL #2   Title patient will demonstrate increased Rt knee flexion to >100 degrees to be able to stand from typical chair without using UEs for support   Status On-going   PT LONG TERM GOAL #3   Title Patient will dmeonstrate 5/5 MMT for Rt knee flexion and extension to ambulate up and down stairs withtou assistance.    Status On-going   PT LONG TERM GOAL #4   Title Patient wll be able to ambulate withtou assistive device.    PT LONG TERM GOAL #5   Title Patient will dmeonstrate increased hip intenernal rototion ROm to 35 degrees to improve deceleration mechanics               Plan - 06/02/14 1620    Clinical Impression Statement Patient displasy continued limitation in knee flexion/extension with pain at end range flexion. patient's knee scar/scabbing appears to no be decreasign in size and demosntrates yellow exudae with redness. Patient's MD saw patient today and stated seesing these qualities in the patient's knee wound and does not believe he needs wound care. This therapist disagrees as continued lack of healing, swelling, redness, pain, and yellow exudate are all signs of likely to possible knee infection. Patient was advised that if knee contineus to loofk as it does or pain worsens to return to MD or get a second opinion. Note gait was improved at the end of the session following introduction of cybex machine exercises   PT Next Visit Plan Continues with stretches to improve flexibility and excursion walking with brace on to improve hip mobility. Continuefocus on increasing both terminal extension and knee flexion. no squatting or lunging beyond 30 degrees per MD.          Problem List Patient Active Problem List   Diagnosis Date Noted  . Quadriceps muscle  rupture     Leia Alf 06/02/2014, 4:21 PM  [facility] [address] [place], [name], [address] Phone: [phone]   Fax:  [phone]

## 2014-06-02 NOTE — Progress Notes (Signed)
Patient ID: Larry Graves, male   DOB: Aug 24, 1945, 69 y.o.   MRN: [id number] Postop note  Chief Complaint  Patient presents with  . Follow-up    4 week follow up Right Quad Tendon Repair, DOS 03/10/14    The patient is 12 weeks status post quadriceps tendon repair he's in a hinged knee brace he's doing well he has an anterior scar scab which is shrinking. He did have some discharge from it yesterday no tenderness no swelling there is some erythema surrounding it but it does not appear to be cellulitic. He has a knee flexion contracture and his knee flexion is 86  He should continue physical therapy and follow-up with me in a month  Instructions are to let the scab fall off on its own no extra treatment needs to be done.

## 2014-06-03 ENCOUNTER — Ambulatory Visit: Payer: Medicare HMO | Admitting: Orthopedic Surgery

## 2014-06-04 ENCOUNTER — Ambulatory Visit (HOSPITAL_COMMUNITY): Payer: Medicare PPO

## 2014-06-04 DIAGNOSIS — M25661 Stiffness of right knee, not elsewhere classified: Secondary | ICD-10-CM

## 2014-06-04 DIAGNOSIS — Z9889 Other specified postprocedural states: Secondary | ICD-10-CM

## 2014-06-04 DIAGNOSIS — R262 Difficulty in walking, not elsewhere classified: Secondary | ICD-10-CM

## 2014-06-04 NOTE — Therapy (Signed)
[place], [name], [address] Phone: [phone]   Fax:  [phone]  Physical Therapy Treatment  Patient Details  Name: Larry Graves MRN: [id number] Date of Birth: 18-Jul-1945 Referring Provider:  Delphina Cahill, MD  Encounter Date: 06/04/2014      PT End of Session - 06/04/14 1430    Visit Number 9   Number of Visits 18   Date for PT Re-Evaluation 06/11/14   Authorization Type [employer]   Authorization - Visit Number 9   Authorization - Number of Visits 10   PT Start Time 1424   PT Stop Time 1518   PT Time Calculation (min) 54 min   Activity Tolerance Patient tolerated treatment well   Behavior During Therapy [facility] for tasks assessed/performed      Past Medical History  Diagnosis Date  . Hypertension   . Diabetes mellitus without complication   . Heart murmur   . BPH (benign prostatic hyperplasia)   . History of alcoholism     Past Surgical History  Procedure Laterality Date  . Replacement total knee Left 2000?    [facility] in [place], [place]  . Carpal tunnel release Bilateral 1990's    [facility] in [place], [place]  . Nasal septum surgery  1980's    [facility] [place], [place]  . Foot surgery Left 2010?    bunionectomy-[facility] in [place], [place]  . Appendectomy  [facility] in [place], [place]  . Hernia repair  1953?    [facility] in [place], [place]  . Melanoma excision      on face-all done in office   . Quadriceps tendon repair Right 03/10/2014    Procedure: REPAIR QUADRICEP TENDON;  Surgeon: Carole Civil, MD;  Location: [facility];  Service: Orthopedics;  Laterality: Right;    There were no vitals filed for this visit.  Visit Diagnosis:  Knee stiffness, right  Hx of right knee surgery  Difficulty walking      Subjective Assessment - 06/04/14 1424    Symptoms Pt stated he rode lawn mower earlier today, about 4 hours to complete.  Pt with black  wound exposed and noted drainage, stated he wore pants and covered it up while mowing.   Currently in Pain? No/denies            [facility] PT Assessment - 06/04/14 0001    Assessment   Medical Diagnosis Rt patellar tendon repair.    Onset Date 03/10/14   Next MD Visit Aline Brochure 06/02/2014.    Prior Therapy no   Precautions   Precaution Comments Diabetes, good ciculation.                    [facility] Adult PT Treatment/Exercise - 06/04/14 1444    Exercises   Exercises Knee/Hip   Knee/Hip Exercises: Stretches   Active Hamstring Stretch 3 reps;30 seconds   Active Hamstring Stretch Limitations 3way to 14"   Quad Stretch 3 reps;30 seconds   Quad Stretch Limitations prone with rope   Knee: Self-Stretch Limitations supine heel slide 20x    Gastroc Stretch 3 reps;30 seconds   Gastroc Stretch Limitations slant board   Knee/Hip Exercises: Aerobic   Stationary Bike Rocking for ROM seat 10 8 min   Knee/Hip Exercises: Supine   Short Arc Quad Sets 20 reps   Heel Slides 20 reps   Heel Slides Limitations 0-86  Terminal Knee Extension AAROM;Right;15 reps   Knee/Hip Exercises: Prone   Other Prone Exercises TKE 10 x5"   Knee/Hip Exercises: Machines for Strengthening   Cybex Knee Extension 2PL 2x 10   Cybex Knee Flexion 3.5PL 2x 10                  PT Short Term Goals - 06/04/14 1434    PT SHORT TERM GOAL #1   Title patient will demonstrate increased Rt knee flexion to 60 degrees to be able to stand from high chairs without using UEs for support.    Status Achieved   PT SHORT TERM GOAL #2   Title Patient will be able to fully extend knee to normalize stride mechanics of gait.    Status Partially Met   PT SHORT TERM GOAL #3   Title Patient will demonstrate independence with HEP.    Status Achieved   PT SHORT TERM GOAL #4   Title Patient will dmeonstrate increased hip intenernal totion ROM to 25 degrees to improve decelration mechanics   Status Achieved            PT Long Term Goals - 06/04/14 1434    PT LONG TERM GOAL #1   Title patient will demonstrate increased Rt knee flexion to 90 degrees to be able to stand from typical chair without using UEs for support   PT LONG TERM GOAL #2   Title patient will demonstrate increased Rt knee flexion to >100 degrees to be able to stand from typical chair without using UEs for support   PT LONG TERM GOAL #3   Title Patient will dmeonstrate 5/5 MMT for Rt knee flexion and extension to ambulate up and down stairs withtou assistance.    PT LONG TERM GOAL #4   Title Patient wll be able to ambulate withtou assistive device.    PT LONG TERM GOAL #5   Title Patient will dmeonstrate increased hip intenernal rototion ROm to 35 degrees to improve deceleration mechanics               Plan - 06/04/14 1458    Clinical Impression Statement Rt knee continues to have black thick scab, yellow exudate drainage, swelling, redness perimeter of wound, no noted heat.  Continued session focus on improving flexibilty with streches and ROM and strengtheing exercises for increased hamstirng and quadricep activaiton to improve knee extension and flexion.  No reports of pain through session.  AROM 0-86.   PT Next Visit Plan Gcode due next session.  Continues with stretches to improve flexibility and excursion walking with brace on to improve hip mobility. Continue focus on increasing both terminal extension and knee flexion. no lunges or squatting beyond 30 degrees per MD.         Problem List Patient Active Problem List   Diagnosis Date Noted  . Quadriceps muscle rupture    Ihor Austin, [place]  Aldona Lento 06/04/2014, [place] [address] [place], [name], [address] Phone: [phone]   Fax:  [phone]

## 2014-06-07 ENCOUNTER — Ambulatory Visit (HOSPITAL_COMMUNITY): Payer: Medicare PPO

## 2014-06-07 DIAGNOSIS — M25661 Stiffness of right knee, not elsewhere classified: Secondary | ICD-10-CM

## 2014-06-07 DIAGNOSIS — Z9889 Other specified postprocedural states: Secondary | ICD-10-CM

## 2014-06-07 DIAGNOSIS — R262 Difficulty in walking, not elsewhere classified: Secondary | ICD-10-CM

## 2014-06-07 NOTE — Therapy (Addendum)
[place], [name], [address] Phone: [phone]   Fax:  [phone]  Physical Therapy Treatment  Patient Details  Name: Larry Graves MRN: [id number] Date of Birth: Oct 28, 1945 Referring Provider:  Carole Civil, MD  Encounter Date: 06/07/2014      PT End of Session - 06/07/14 1609    Visit Number 10   Number of Visits 18   Date for PT Re-Evaluation 06/11/14   Authorization Type [employer] Medicare   Authorization Time Period Gcode complete on 10th session   Authorization - Visit Number 10   Authorization - Number of Visits 20   PT Start Time 1518   PT Stop Time 1605   PT Time Calculation (min) 47 min   Activity Tolerance Patient tolerated treatment well   Behavior During Therapy [facility] for tasks assessed/performed      Past Medical History  Diagnosis Date  . Hypertension   . Diabetes mellitus without complication   . Heart murmur   . BPH (benign prostatic hyperplasia)   . History of alcoholism     Past Surgical History  Procedure Laterality Date  . Replacement total knee Left 2000?    [facility] in [place], [place]  . Carpal tunnel release Bilateral 1990's    [facility] in [place], [place]  . Nasal septum surgery  1980's    [facility] [place], [place]  . Foot surgery Left 2010?    bunionectomy-[facility] in [place], [place]  . Appendectomy  [facility] in [place], [place]  . Hernia repair  1953?    [facility] in [place], [place]  . Melanoma excision      on face-all done in office   . Quadriceps tendon repair Right 03/10/2014    Procedure: REPAIR QUADRICEP TENDON;  Surgeon: Carole Civil, MD;  Location: [facility];  Service: Orthopedics;  Laterality: Right;    There were no vitals filed for this visit.  Visit Diagnosis:  Knee stiffness, right  Hx of right knee surgery  Difficulty walking      Subjective Assessment - 06/07/14 1532    Symptoms Pain free today,  stated scab doesn't want to come off, has not been messing with it.  Reported drainage is decreaseing   Currently in Pain? No/denies            [facility] PT Assessment - 06/07/14 0001    Assessment   Medical Diagnosis Rt patellar tendon repair.    Onset Date 03/10/14   Next MD Visit Aline Brochure 07/05/2014   Prior Therapy no   Precautions   Precaution Comments Diabetes, good ciculation.    Observation/Other Assessments   Focus on Therapeutic Outcomes (FOTO)  58.7 was 39.083                   [facility] Adult PT Treatment/Exercise - 06/07/14 0001    Exercises   Exercises Knee/Hip   Knee/Hip Exercises: Stretches   Active Hamstring Stretch 3 reps;30 seconds   Active Hamstring Stretch Limitations 3way to 14"   Quad Stretch 3 reps;30 seconds   Quad Stretch Limitations prone with rope   Knee: Self-Stretch Limitations supine heel slide 20x    Gastroc Stretch 3 reps;30 seconds   Gastroc Stretch Limitations slant board   Knee/Hip Exercises: Aerobic   Stationary Bike Rocking for ROM seat 10 8 min   Knee/Hip Exercises: Machines for Strengthening   Cybex Knee Extension  2PL 2x 10 start position 9   Cybex Knee Flexion 4PL 2x 10   Knee/Hip Exercises: Supine   Heel Slides 20 reps   Heel Slides Limitations 0-90   Knee/Hip Exercises: Prone   Other Prone Exercises TKE 10 x5"                  PT Short Term Goals - 06/07/14 1623    PT SHORT TERM GOAL #1   Title patient will demonstrate increased Rt knee flexion to 60 degrees to be able to stand from high chairs without using UEs for support.    Status Achieved   PT SHORT TERM GOAL #2   Title Patient will be able to fully extend knee to normalize stride mechanics of gait.    Status Partially Met   PT SHORT TERM GOAL #3   Title Patient will demonstrate independence with HEP.    Status Achieved   PT SHORT TERM GOAL #4   Title Patient will dmeonstrate increased hip intenernal totion ROM to 25 degrees to improve decelration  mechanics   Status Achieved           PT Long Term Goals - 06/07/14 1623    PT LONG TERM GOAL #1   Title patient will demonstrate increased Rt knee flexion to 90 degrees to be able to stand from typical chair without using UEs for support   Baseline AROM 0-90   Status Partially Met   PT LONG TERM GOAL #2   Title patient will demonstrate increased Rt knee flexion to >100 degrees to be able to stand from typical chair without using UEs for support   Status On-going   PT LONG TERM GOAL #3   Title Patient will dmeonstrate 5/5 MMT for Rt knee flexion and extension to ambulate up and down stairs withtou assistance.    Status On-going   PT LONG TERM GOAL #4   Title Patient wll be able to ambulate without assistive device.    Status On-going   PT LONG TERM GOAL #5   Title Patient will dmeonstrate increased hip intenernal rototion ROm to 35 degrees to improve deceleration mechanics               Plan - 06/07/14 1610    Clinical Impression Statement AROM improving 0-90 following stretches and AROM exercises.  Pt continues to have black scab with yellow exudate drainage from incision with redness perimeter of wound.  Improved gait mechanics noted with improved AROM.  No reports of pain through session.  FOTO complete with vast improvements in perceived functional abiliies.   PT Next Visit Plan Continues with stretches to improve flexibility and excursion walking with brace on to improve hip mobility. Continue focus on increasing both terminal extension and knee flexion. no lunges or squatting beyond 30 degrees per MD.         Problem List Patient Active Problem List   Diagnosis Date Noted  . Quadriceps muscle rupture    Ihor Austin, [place]  Aldona Lento 06/07/2014, 4:37 PM  [place] [address] [place], [name], [address] Phone: [phone]   Fax:  [phone]           G-Codes - 06-29-14 3335     Functional Assessment Tool Used FOTO  with score of 58.7 was 39.08   Functional Limitation Mobility: Walking and moving around   Mobility: Walking and Moving Around Current Status [phone]) At least 40 percent but less  than 60 percent impaired, limited or restricted   Mobility: Walking and Moving Around Goal Status [phone]) At least 20 percent but less than 40 percent impaired, limited or restricted     Devona Konig PT DPT [phone]

## 2014-06-08 DIAGNOSIS — M25661 Stiffness of right knee, not elsewhere classified: Secondary | ICD-10-CM | POA: Diagnosis not present

## 2014-06-09 ENCOUNTER — Ambulatory Visit (HOSPITAL_COMMUNITY): Payer: Medicare PPO | Admitting: Physical Therapy

## 2014-06-09 DIAGNOSIS — R262 Difficulty in walking, not elsewhere classified: Secondary | ICD-10-CM

## 2014-06-09 DIAGNOSIS — M25661 Stiffness of right knee, not elsewhere classified: Secondary | ICD-10-CM | POA: Diagnosis not present

## 2014-06-09 DIAGNOSIS — Z9889 Other specified postprocedural states: Secondary | ICD-10-CM

## 2014-06-09 NOTE — Therapy (Signed)
[place], [name], [address] Phone: [phone]   Fax:  [phone]  Physical Therapy Treatment  Patient Details  Name: Larry Graves MRN: [id number] Date of Birth: [birth year] Referring Provider:  Carole Civil, MD  Encounter Date: 06/09/2014      PT End of Session - 06/09/14 1548    Visit Number 11   Number of Visits 18   Date for PT Re-Evaluation 06/11/14   Authorization Type [employer] Medicare   Authorization Time Period Gcode complete on 10th session   Authorization - Visit Number 11   Authorization - Number of Visits 20   PT Start Time 7673   PT Stop Time 1600   PT Time Calculation (min) 43 min   Activity Tolerance Patient tolerated treatment well   Behavior During Therapy [facility] for tasks assessed/performed      Past Medical History  Diagnosis Date  . Hypertension   . Diabetes mellitus without complication   . Heart murmur   . BPH (benign prostatic hyperplasia)   . History of alcoholism     Past Surgical History  Procedure Laterality Date  . Replacement total knee Left 2000?    [facility] in [place], [place]  . Carpal tunnel release Bilateral 1990's    [facility] in [place], [place]  . Nasal septum surgery  1980's    [facility] [place], [place]  . Foot surgery Left 2010?    bunionectomy-[facility] in [place], [place]  . Appendectomy  [facility] in [place], [place]  . Hernia repair  1953?    [facility] in [place], [place]  . Melanoma excision      on face-all done in office   . Quadriceps tendon repair Right 03/10/2014    Procedure: REPAIR QUADRICEP TENDON;  Surgeon: Carole Civil, MD;  Location: [facility];  Service: Orthopedics;  Laterality: Right;    There were no vitals filed for this visit.  Visit Diagnosis:  Knee stiffness, right  Hx of right knee surgery  Difficulty walking      Subjective Assessment - 06/09/14 1535    Symptoms Pain free today,  stated scab doesn't want to come off, has not been messing with it.  Reported drainage is decreaseing. Patient still surpised that wound has not fully healed as it has been present since 03/11/15 and has not fully healed. States he hoped the scap would fall off leaving skin under wound upon showering but that has not happened. patient ntoes continued difficulty bending knee but does not believe MD want's his knee to bend.    Currently in Pain? No/denies                       [facility] Adult PT Treatment/Exercise - 06/09/14 0001    Knee/Hip Exercises: Stretches   Active Hamstring Stretch 3 reps;30 seconds   Active Hamstring Stretch Limitations 3way to 14"   Quad Stretch 3 reps;30 seconds   Quad Stretch Limitations prone with rope   Knee: Self-Stretch Limitations supine heel slide 20x knee driver 3 way 41P each   Gastroc Stretch 3 reps;30 seconds   Gastroc Stretch Limitations slant board   Knee/Hip Exercises: Machines for Strengthening   Cybex Knee Extension 2PL 3x 10 start position 9 (position 10)    Cybex Knee Flexion 4PL 3x 10 (last set with 1plate to increase focus on increasing ROM.  Knee/Hip Exercises: Standing   Gait Training high knees and butt kicks 51f each   Other Standing Knee Exercises 2D walking hip excursions  50 ft each                  PT Short Term Goals - 06/07/14 1623    PT SHORT TERM GOAL #1   Title patient will demonstrate increased Rt knee flexion to 60 degrees to be able to stand from high chairs without using UEs for support.    Status Achieved   PT SHORT TERM GOAL #2   Title Patient will be able to fully extend knee to normalize stride mechanics of gait.    Status Partially Met   PT SHORT TERM GOAL #3   Title Patient will demonstrate independence with HEP.    Status Achieved   PT SHORT TERM GOAL #4   Title Patient will dmeonstrate increased hip intenernal totion ROM to 25 degrees to improve decelration mechanics   Status Achieved            PT Long Term Goals - 06/07/14 1623    PT LONG TERM GOAL #1   Title patient will demonstrate increased Rt knee flexion to 90 degrees to be able to stand from typical chair without using UEs for support   Baseline AROM 0-90   Status Partially Met   PT LONG TERM GOAL #2   Title patient will demonstrate increased Rt knee flexion to >100 degrees to be able to stand from typical chair without using UEs for support   Status On-going   PT LONG TERM GOAL #3   Title Patient will dmeonstrate 5/5 MMT for Rt knee flexion and extension to ambulate up and down stairs withtou assistance.    Status On-going   PT LONG TERM GOAL #4   Title Patient wll be able to ambulate without assistive device.    Status On-going   PT LONG TERM GOAL #5   Title Patient will dmeonstrate increased hip intenernal rototion ROm to 35 degrees to improve deceleration mechanics               Plan - 06/09/14 1549    Clinical Impression Statement AROM improving 0-90 following stretches and AROM exercises. Pt continues to have black scab with yellow exudate drainage from incision with redness perimeter of wound. Improved gait mechanics noted with improved AROM. Patient's MD sent message to inform him of lack of wound healing and request for wound care evaluation. Patient dmeonstrates improvign ROM and function.    PT Next Visit Plan Continues with stretches to improve flexibility and excursion walking with brace on to improve hip mobility. Continue focus on increasing knee flexion. no lunges or squatting beyond 30 degrees per MD.           GLucy [place]ua- 004/07/160904    Functional Assessment Tool Used FOTO  with score of 58.7 was 39.08   Functional Limitation Mobility: Walking and moving around   Mobility: Walking and Moving Around Current Status ([phone] At least 40 percent but less than 60 percent impaired, limited or restricted   Mobility: Walking and Moving Around Goal Status ([phone] At least 20 percent but  less than 40 percent impaired, limited or restricted      Problem List Patient Active Problem List   Diagnosis Date Noted  . Quadriceps muscle rupture    CDevona KonigPT DPT [address] [address]  Phone: [phone]   Fax:  [phone]

## 2014-06-10 ENCOUNTER — Telehealth: Payer: Self-pay | Admitting: *Deleted

## 2014-06-10 NOTE — Telephone Encounter (Signed)
Wife called asking about new brace, stated Dr Aline Brochure mentioned wanting to change to different brace.  Did you want him in hinged wrap brace now or when he comes back?

## 2014-06-11 ENCOUNTER — Ambulatory Visit (HOSPITAL_COMMUNITY): Payer: Medicare PPO | Attending: Orthopedic Surgery | Admitting: Physical Therapy

## 2014-06-11 DIAGNOSIS — M25661 Stiffness of right knee, not elsewhere classified: Secondary | ICD-10-CM | POA: Diagnosis present

## 2014-06-11 DIAGNOSIS — R262 Difficulty in walking, not elsewhere classified: Secondary | ICD-10-CM

## 2014-06-11 DIAGNOSIS — Z9889 Other specified postprocedural states: Secondary | ICD-10-CM

## 2014-06-11 NOTE — Therapy (Signed)
[place], [name], [address] Phone: [phone]   Fax:  [phone]  Physical Therapy Treatment  Patient Details  Name: Larry Graves MRN: [id number] Date of Birth: Sep 07, 1945 Referring Provider:  Carole Civil, MD  Encounter Date: 06/11/2014      PT End of Session - 06/11/14 1509    Visit Number 12   Number of Visits 18   Date for PT Re-Evaluation 07/11/14   Authorization Type [employer] Medicare   Authorization Time Period Gcode complete on 10th session   Authorization - Visit Number 12   Authorization - Number of Visits 20   PT Start Time 6962   PT Stop Time 1515   PT Time Calculation (min) 44 min   Activity Tolerance Patient tolerated treatment well   Behavior During Therapy [facility] for tasks assessed/performed      Past Medical History  Diagnosis Date  . Hypertension   . Diabetes mellitus without complication   . Heart murmur   . BPH (benign prostatic hyperplasia)   . History of alcoholism     Past Surgical History  Procedure Laterality Date  . Replacement total knee Left 2000?    [facility] in [place], [place]  . Carpal tunnel release Bilateral 1990's    [facility] in [place], [place]  . Nasal septum surgery  1980's    [facility] [place], [place]  . Foot surgery Left 2010?    [facility]unionectomy-[facility] in [place], [place]  . Appendectomy  [facility] in [place], [place]  . Hernia repair  1953?    [facility] in [place], [place]  . Melanoma excision      on face-all done in office   . Quadriceps tendon repair Right 03/10/2014    Procedure: REPAIR QUADRICEP TENDON;  Surgeon: Carole Civil, MD;  Location: [facility];  Service: Orthopedics;  Laterality: Right;    There were no vitals filed for this visit.  Visit Diagnosis:  No diagnosis found.      Subjective Assessment - 06/11/14 1501    Symptoms Pain free today, stated scab doesn't want to come off, has not been  messing with it.  Reports he has been rubbing on scab more and notes yellow discharge that appears to be pus like, and yellow.                        [facility] Adult PT Treatment/Exercise - 06/11/14 1502    Knee/Hip Exercises: Stretches   Active Hamstring Stretch 3 reps;30 seconds   Active Hamstring Stretch Limitations 3way to 14"   Quad Stretch 3 reps;30 seconds   Quad Stretch Limitations prone with rope   Knee: Self-Stretch Limitations supine heel slide 20x knee driver 3 way 95M each   Gastroc Stretch 3 reps;30 seconds   Gastroc Stretch Limitations slant board   Knee/Hip Exercises: Machines for Strengthening   Cybex Knee Extension 2PL 3x 10 start position 9 (position 10)    Cybex Knee Flexion 4PL 3x 10 (last set with 1plate to increase focus on increasing ROM.    Knee/Hip Exercises: Standing   Forward Step Up Step Height: 4";10 reps   Gait Training high knees and butt kicks 62f each   Other Standing Knee Exercises 2D hip excursions in neutral and split stance 10x each. (No transverse plane)   Manual Therapy   Manual Therapy Joint mobilization   Joint  Mobilization patellar mopbs all directions grade 1,2,3                  PT Short Term Goals - 06/07/14 1623    PT SHORT TERM GOAL #1   Title patient will demonstrate increased Rt knee flexion to 60 degrees to be able to stand from high chairs without using UEs for support.    Status Achieved   PT SHORT TERM GOAL #2   Title Patient will be able to fully extend knee to normalize stride mechanics of gait.    Status Partially Met   PT SHORT TERM GOAL #3   Title Patient will demonstrate independence with HEP.    Status Achieved   PT SHORT TERM GOAL #4   Title Patient will dmeonstrate increased hip intenernal totion ROM to 25 degrees to improve decelration mechanics   Status Achieved           PT Long Term Goals - 06/07/14 1623    PT LONG TERM GOAL #1   Title patient will demonstrate increased Rt knee  flexion to 90 degrees to be able to stand from typical chair without using UEs for support   Baseline AROM 0-90   Status Partially Met   PT LONG TERM GOAL #2   Title patient will demonstrate increased Rt knee flexion to >100 degrees to be able to stand from typical chair without using UEs for support   Status On-going   PT LONG TERM GOAL #3   Title Patient will dmeonstrate 5/5 MMT for Rt knee flexion and extension to ambulate up and down stairs withtou assistance.    Status On-going   PT LONG TERM GOAL #4   Title Patient wll be able to ambulate without assistive device.    Status On-going   PT LONG TERM GOAL #5   Title Patient will dmeonstrate increased hip intenernal rototion ROm to 35 degrees to improve deceleration mechanics               Plan - 06/11/14 1512    Clinical Impression Statement Wound on knee continues to display yellow discharge. Noted contineud limited knee flexion to 85 degrees weight bearing 90 degrees NWB. patient dmeosntrates improving strength with ability to perform 4 " steps with 1 HHA and good control.     PT Next Visit Plan Continues with stretches to improve flexibility and excursion walking with brace on to improve hip mobility. Continue focus on increasing knee flexion. no lunges or squatting beyond 30 degrees per MD.         Problem List Patient Active Problem List   Diagnosis Date Noted  . Quadriceps muscle rupture    Devona Konig PT DPT [place], [name], [address] Phone: [phone]   Fax:  [phone]

## 2014-06-14 ENCOUNTER — Ambulatory Visit (HOSPITAL_COMMUNITY): Payer: Medicare PPO | Admitting: Physical Therapy

## 2014-06-14 DIAGNOSIS — R262 Difficulty in walking, not elsewhere classified: Secondary | ICD-10-CM

## 2014-06-14 DIAGNOSIS — M25661 Stiffness of right knee, not elsewhere classified: Secondary | ICD-10-CM

## 2014-06-14 DIAGNOSIS — Z9889 Other specified postprocedural states: Secondary | ICD-10-CM

## 2014-06-14 NOTE — Telephone Encounter (Signed)
Routing to [employer] for scheduling

## 2014-06-14 NOTE — Therapy (Signed)
[place], [name], [address] Phone: [phone]   Fax:  [phone]  Physical Therapy Treatment  Patient Details  Name: Larry Graves MRN: [id number] Date of Birth: [birth year] Referring Provider:  Carole Civil, MD  Encounter Date: 06/14/2014      PT End of Session - 06/14/14 1441    Visit Number 13   Number of Visits 18   Date for PT Re-Evaluation 07/11/14   Authorization Type [employer]   Authorization Time Period Gcode complete on 10th session   Authorization - Visit Number 13   Authorization - Number of Visits 20   Activity Tolerance Patient tolerated treatment well   Behavior During Therapy [facility] for tasks assessed/performed      Past Medical History  Diagnosis Date  . Hypertension   . Diabetes mellitus without complication   . Heart murmur   . BPH (benign prostatic hyperplasia)   . History of alcoholism     Past Surgical History  Procedure Laterality Date  . Replacement total knee Left 2000?    [facility] in [place], [place]  . Carpal tunnel release Bilateral 1990's    [facility] in [place], [place]  . Nasal septum surgery  1980's    [facility] [place], [place]  . Foot surgery Left 2010?    bunionectomy-[facility] in [place], [place]  . Appendectomy  [facility] in [place], [place]  . Hernia repair  1953?    [facility] in [place], [place]  . Melanoma excision      on face-all done in office   . Quadriceps tendon repair Right 03/10/2014    Procedure: REPAIR QUADRICEP TENDON;  Surgeon: Carole Civil, MD;  Location: [facility];  Service: Orthopedics;  Laterality: Right;    There were no vitals filed for this visit.  Visit Diagnosis:  Knee stiffness, right  Hx of right knee surgery  Difficulty walking      Subjective Assessment - 06/14/14 1440    Subjective No pain today, patient arrived after having done some weed whacking and taken a shower. Patient  states that he is somewhat concerned regarding the area on his knee, as he has had MRSA in the past and does not want to get a major infection like that again.    Pertinent History 03/10/14 Rt ratellar tendon repair following complete tear, Patient's hobbies ionclude: camoping, fishing, hunting, working in [employer]. Patient's wound severely escarded secondary to history of a blood blister on Rt knee bursting resulting in excessive escar and scabbing. patient is retired.  no previous therapy   Currently in Pain? No/denies                       [facility] Adult PT Treatment/Exercise - 06/14/14 0001    Knee/Hip Exercises: Stretches   Active Hamstring Stretch 3 reps;30 seconds   Active Hamstring Stretch Limitations 3way to 14"   Quad Stretch 3 reps;30 seconds   Quad Stretch Limitations prone with rope   Piriformis Stretch 2 reps;30 seconds   Piriformis Stretch Limitations supine   Gastroc Stretch 3 reps;30 seconds   Gastroc Stretch Limitations slant board   Knee/Hip Exercises: Aerobic   Stationary Bike Rocking for ROM seat 10 x10 minutes   Knee/Hip Exercises: Standing   Other Standing Knee Exercises Walking lateral hip excursions 1x10 with brace applied   Other Standing Knee Exercises 2D  hip excursions 1x15    Knee/Hip Exercises: Supine   Bridges Both;1 set;10 reps   Bridges Limitations pushoff with L leg only, R leg straight   Straight Leg Raises Both;1 set;10 reps   Knee/Hip Exercises: Sidelying   Hip ABduction Both;1 set;10 reps   Hip ABduction Limitations manual cues for proper form   Knee/Hip Exercises: Prone   Other Prone Exercises Prone hip extensions 1x10 with manual cues for proper form                PT Education - 06/14/14 1426    Education provided Yes   Education Details Education regarding status of scab on knee, as proximal segment has started to separate from tissue below; patient educated that antibiotics likely not necessary however wound  care would be beneficial to assist in wound healing, however PT can only do wound care if MD allows   Person(s) Educated Patient   Methods Explanation   Comprehension Verbalized understanding          PT Short Term Goals - 06/07/14 1623    PT SHORT TERM GOAL #1   Title patient will demonstrate increased Rt knee flexion to 60 degrees to be able to stand from high chairs without using UEs for support.    Status Achieved   PT SHORT TERM GOAL #2   Title Patient will be able to fully extend knee to normalize stride mechanics of gait.    Status Partially Met   PT SHORT TERM GOAL #3   Title Patient will demonstrate independence with HEP.    Status Achieved   PT SHORT TERM GOAL #4   Title Patient will dmeonstrate increased hip intenernal totion ROM to 25 degrees to improve decelration mechanics   Status Achieved           PT Long Term Goals - 06/07/14 1623    PT LONG TERM GOAL #1   Title patient will demonstrate increased Rt knee flexion to 90 degrees to be able to stand from typical chair without using UEs for support   Baseline AROM 0-90   Status Partially Met   PT LONG TERM GOAL #2   Title patient will demonstrate increased Rt knee flexion to >100 degrees to be able to stand from typical chair without using UEs for support   Status On-going   PT LONG TERM GOAL #3   Title Patient will dmeonstrate 5/5 MMT for Rt knee flexion and extension to ambulate up and down stairs withtou assistance.    Status On-going   PT LONG TERM GOAL #4   Title Patient wll be able to ambulate without assistive device.    Status On-going   PT LONG TERM GOAL #5   Title Patient will dmeonstrate increased hip intenernal rototion ROm to 35 degrees to improve deceleration mechanics               Plan - 06/14/14 1441    Clinical Impression Statement Black eschar on knee noted to be beginning to separate from tissue underneath; no odor noted but plentiful amounts of yellow pus noted beneath the  eschar. Consulted fellow clinician who does not recommend antibiotics at this time however confirms that wound care would be beneficial should patient's MD allow it. Wound was traced with permanent marker to track inflammation around area. Measures of inflammation are as follows: extends 3cm at furtherest medial from vertical midline of knee; extends 1.75cm at furthest lateral from vertical midline of knee; extends 0.6cm proximally at furthest  from proximal edge of eschar; extends 1.5cm distal at furthest from distal edge of eschar. Patient toleratted session well but does demonstrate some weakness in hip abductors and extensors; cues for proper performance of walking hip excursions.   Pt will benefit from skilled therapeutic intervention in order to improve on the following deficits Abnormal gait;Decreased endurance;Improper body mechanics;Impaired flexibility;Decreased strength;Difficulty walking;Pain;Decreased range of motion   Rehab Potential Good   PT Frequency 3x / week   PT Duration 6 weeks   PT Next Visit Plan Continues with stretches to improve flexibility and excursion walking with brace on to improve hip mobility. Continue focus on increasing knee flexion. no lunges or squatting beyond 30 degrees per MD.    PT Home Exercise Plan Calf, hamstring, hip flexor stetch with anterior knee driver    Consulted and Agree with Plan of Care Patient        Problem List Patient Active Problem List   Diagnosis Date Noted  . Quadriceps muscle rupture     Deniece Ree PT, DPT [place] [address] [place], [name], [address] Phone: [phone]   Fax:  [phone]

## 2014-06-14 NOTE — Telephone Encounter (Signed)
i want to see him Monday afternoon to check his wound and fit for econ brace wrap style

## 2014-06-15 NOTE — Telephone Encounter (Signed)
Patient is scheduled for Monday 06/21/14 at 3:15 and he is aware

## 2014-06-16 ENCOUNTER — Ambulatory Visit (HOSPITAL_COMMUNITY): Payer: Medicare PPO | Admitting: Physical Therapy

## 2014-06-16 DIAGNOSIS — M25661 Stiffness of right knee, not elsewhere classified: Secondary | ICD-10-CM

## 2014-06-16 DIAGNOSIS — Z9889 Other specified postprocedural states: Secondary | ICD-10-CM

## 2014-06-16 DIAGNOSIS — R262 Difficulty in walking, not elsewhere classified: Secondary | ICD-10-CM

## 2014-06-16 NOTE — Therapy (Signed)
[place], [name], [address] Phone: [phone]   Fax:  [phone]  Physical Therapy Treatment  Patient Details  Name: Larry Graves MRN: [id number] Date of Birth: [birth year] Referring Provider:  Carole Civil, MD  Encounter Date: 06/16/2014      PT End of Session - 06/16/14 1558    Visit Number 14   Number of Visits 18   Date for PT Re-Evaluation 07/11/14   Authorization Type [employer] Medicare   Authorization Time Period Gcode complete on 10th session   Authorization - Visit Number 14   Authorization - Number of Visits 20   PT Start Time 9675   PT Stop Time 1600   PT Time Calculation (min) 42 min   Activity Tolerance Patient tolerated treatment well   Behavior During Therapy [facility] for tasks assessed/performed      Past Medical History  Diagnosis Date  . Hypertension   . Diabetes mellitus without complication   . Heart murmur   . BPH (benign prostatic hyperplasia)   . History of alcoholism     Past Surgical History  Procedure Laterality Date  . Replacement total knee Left 2000?    [facility] in [place], [place]  . Carpal tunnel release Bilateral 1990's    [facility] in [place], [place]  . Nasal septum surgery  1980's    [facility] in [place], [place]  . Foot surgery Left 2010?    bunionectomy-[facility] in [place], [place]  . Appendectomy  [facility] in [place], [place]  . Hernia repair  1953?    [facility] in [place], [place]  . Melanoma excision      on face-all done in office   . Quadriceps tendon repair Right 03/10/2014    Procedure: REPAIR QUADRICEP TENDON;  Surgeon: Carole Civil, MD;  Location: [facility];  Service: Orthopedics;  Laterality: Right;    There were no vitals filed for this visit.  Visit Diagnosis:  Knee stiffness, right  Hx of right knee surgery  Difficulty walking          [facility] PT Assessment - 06/16/14 0001    AROM   Right Knee  Extension 0   Right Knee Flexion 96   Left Knee Extension 0   Left Knee Flexion 94                   [facility] Adult PT Treatment/Exercise - 06/16/14 0001    Knee/Hip Exercises: Stretches   Active Hamstring Stretch 3 reps;30 seconds   Active Hamstring Stretch Limitations 3way to 14"   Quad Stretch 3 reps;30 seconds   Quad Stretch Limitations prone with rope   Hip Flexor Stretch 5 reps;20 seconds   Hip Flexor Stretch Limitations to 12" box   Piriformis Stretch 2 reps;30 seconds   Piriformis Stretch Limitations supine   Gastroc Stretch 3 reps;30 seconds   Gastroc Stretch Limitations slant board   Knee/Hip Exercises: Machines for Strengthening   Cybex Knee Extension 2PL 3x 10 start position 9 (position 10)    Cybex Knee Flexion 4PL 3x 10 (last set with 1plate to increase focus on increasing ROM.    Knee/Hip Exercises: Standing   Forward Step Up Step Height: 4";10 reps;Step Height: 2";2 sets;Right   Other Standing Knee Exercises Box walk around 10x each with therapist asssistance for LE positioning.    Other Standing Knee Exercises Single elg toe touch 3D  hip excursions 10x    Knee/Hip Exercises: Supine   Heel Slides 20 reps   Heel Slides Limitations 0-96   Bridges --   Bridges Limitations --                  PT Short Term Goals - 06/07/14 1623    PT SHORT TERM GOAL #1   Title patient will demonstrate increased Rt knee flexion to 60 degrees to be able to stand from high chairs without using UEs for support.    Status Achieved   PT SHORT TERM GOAL #2   Title Patient will be able to fully extend knee to normalize stride mechanics of gait.    Status Partially Met   PT SHORT TERM GOAL #3   Title Patient will demonstrate independence with HEP.    Status Achieved   PT SHORT TERM GOAL #4   Title Patient will dmeonstrate increased hip intenernal totion ROM to 25 degrees to improve decelration mechanics   Status Achieved           PT Long Term Goals - 06/07/14  1623    PT LONG TERM GOAL #1   Title patient will demonstrate increased Rt knee flexion to 90 degrees to be able to stand from typical chair without using UEs for support   Baseline AROM 0-90   Status Partially Met   PT LONG TERM GOAL #2   Title patient will demonstrate increased Rt knee flexion to >100 degrees to be able to stand from typical chair without using UEs for support   Status On-going   PT LONG TERM GOAL #3   Title Patient will dmeonstrate 5/5 MMT for Rt knee flexion and extension to ambulate up and down stairs withtou assistance.    Status On-going   PT LONG TERM GOAL #4   Title Patient wll be able to ambulate without assistive device.    Status On-going   PT LONG TERM GOAL #5   Title Patient will dmeonstrate increased hip intenernal rototion ROm to 35 degrees to improve deceleration mechanics               Plan - 06/16/14 1600    Clinical Impression Statement Session continued focus on icnreasing knee flexion as patient displasys full extension. Noted weakneess in quadriceps at end range extension and noted weakness and severely limited knee flexion. patient also displayed excessive toe out during gait that was improved to neutral fowllowing walk around to box activity with therapist manual assistance to improve hip internal rotation. patient walking much better at end of sesison.    PT Next Visit Plan Continues with stretches to improve flexibility and excursion walking with brace on to improve hip mobility with focus on increasing knee flexion. Continue focus on increasing knee flexion. no lunges or squatting beyond 30 degrees per MD.         Problem List Patient Active Problem List   Diagnosis Date Noted  . Quadriceps muscle rupture    Devona Konig PT DPT [place], [name], [address] Phone: [phone]   Fax:  [phone]

## 2014-06-18 ENCOUNTER — Ambulatory Visit (HOSPITAL_COMMUNITY): Payer: Medicare PPO

## 2014-06-18 DIAGNOSIS — Z9889 Other specified postprocedural states: Secondary | ICD-10-CM

## 2014-06-18 DIAGNOSIS — M25661 Stiffness of right knee, not elsewhere classified: Secondary | ICD-10-CM | POA: Diagnosis not present

## 2014-06-18 DIAGNOSIS — R262 Difficulty in walking, not elsewhere classified: Secondary | ICD-10-CM

## 2014-06-18 NOTE — Therapy (Signed)
[place], [name], [address] Phone: [phone]   Fax:  [phone]  Physical Therapy Treatment  Patient Details  Name: Larry Graves MRN: [id number] Date of Birth: [birth year] Referring Provider:  Delphina Cahill, MD  Encounter Date: 06/18/2014      PT End of Session - 06/18/14 1508    Visit Number 15   Number of Visits 18   Date for PT Re-Evaluation 07/11/14   Authorization Type [employer] Medicare   Authorization Time Period Gcode complete on 10th session   Authorization - Visit Number 15   Authorization - Number of Visits 20   PT Start Time 1430   PT Stop Time 1515   PT Time Calculation (min) 45 min   Activity Tolerance Patient tolerated treatment well   Behavior During Therapy [facility] for tasks assessed/performed      Past Medical History  Diagnosis Date  . Hypertension   . Diabetes mellitus without complication   . Heart murmur   . BPH (benign prostatic hyperplasia)   . History of alcoholism     Past Surgical History  Procedure Laterality Date  . Replacement total knee Left 2000?    [facility] in [place], [place]  . Carpal tunnel release Bilateral 1990's    [facility] in [place], [place]  . Nasal septum surgery  1980's    [facility] [place], [place]  . Foot surgery Left 2010?    bunionectomy-[facility] in [place], [place]  . Appendectomy  [facility] in [place], [place]  . Hernia repair  1953?    [facility] in [place], [place]  . Melanoma excision      on face-all done in office   . Quadriceps tendon repair Right 03/10/2014    Procedure: REPAIR QUADRICEP TENDON;  Surgeon: Carole Civil, MD;  Location: [facility];  Service: Orthopedics;  Laterality: Right;    There were no vitals filed for this visit.  Visit Diagnosis:  Knee stiffness, right  Hx of right knee surgery  Difficulty walking      Subjective Assessment - 06/18/14 1437    Subjective Pain free today.  Pt  stated concern with scab over knee   Currently in Pain? No/denies            [facility] PT Assessment - 06/18/14 0001    Assessment   Medical Diagnosis Rt patellar tendon repair.    Onset Date 03/10/14   Next MD Visit Aline Brochure 06/21/2014   Prior Therapy no   Precautions   Precaution Comments Diabetes, good ciculation.    AROM   Right Knee Extension 0   Right Knee Flexion 98                   [facility] Adult PT Treatment/Exercise - 06/18/14 0001    Knee/Hip Exercises: Stretches   Active Hamstring Stretch 3 reps;30 seconds   Active Hamstring Stretch Limitations 3way to 14"   Quad Stretch 3 reps;30 seconds   Quad Stretch Limitations prone with rope   Piriformis Stretch 3 reps;30 seconds   Piriformis Stretch Limitations supine manual   Gastroc Stretch 3 reps;30 seconds   Gastroc Stretch Limitations slant board   Knee/Hip Exercises: Machines for Strengthening   Cybex Knee Extension 2PL 3x 10 start position 9 (position 10)    Cybex Knee Flexion 4Pl 1x 10; 5 PL 2x 10   Knee/Hip Exercises: Standing   Lateral Step  Up Right;10 reps;Hand Hold: 2;Step Height: 4"   Forward Step Up Right;10 reps;Hand Hold: 2;Step Height: 4"   Other Standing Knee Exercises Box walk around 10x each with therapist asssistance for LE positioning.    Other Standing Knee Exercises Single elg toe touch 3D hip excursions 10x    Knee/Hip Exercises: Supine   Heel Slides 20 reps   Heel Slides Limitations 0-98                  PT Short Term Goals - 06/18/14 1514    PT SHORT TERM GOAL #1   Title patient will demonstrate increased Rt knee flexion to 60 degrees to be able to stand from high chairs without using UEs for support.    Status Achieved   PT SHORT TERM GOAL #2   Title Patient will be able to fully extend knee to normalize stride mechanics of gait.    Baseline 06/18/2014 0-98 degrees   Status Partially Met   PT SHORT TERM GOAL #3   Title Patient will demonstrate independence with HEP.     Status Achieved   PT SHORT TERM GOAL #4   Title Patient will dmeonstrate increased hip intenernal totion ROM to 25 degrees to improve decelration mechanics   Status Achieved           PT Long Term Goals - 06/18/14 1515    PT LONG TERM GOAL #1   Title patient will demonstrate increased Rt knee flexion to 90 degrees to be able to stand from typical chair without using UEs for support   Baseline 06/18/2014 AROM 0-98 degrees   Status Achieved   PT LONG TERM GOAL #2   Title patient will demonstrate increased Rt knee flexion to >100 degrees to be able to stand from typical chair without using UEs for support   Status On-going   PT LONG TERM GOAL #3   Title Patient will dmeonstrate 5/5 MMT for Rt knee flexion and extension to ambulate up and down stairs withtou assistance.    Status On-going   PT LONG TERM GOAL #4   Title Patient wll be able to ambulate without assistive device.    Status Achieved   PT LONG TERM GOAL #5   Title Patient will dmeonstrate increased hip intenernal rototion ROm to 35 degrees to improve deceleration mechanics   Status On-going               Plan - 06/18/14 1509    Clinical Impression Statement Continued session focus on improving AROM for knee and overall hip mobilty.  Increased ease with piriformis stretches today and able to fully extend knee wiht no lacking today.  AROM 0-98 degrees.  Pt's knee continues to display exposed black scab on Rt knee with redness surrounding incision and eschar underneath, noted decreased draininge today.  No reorts of pain through session.     PT Next Visit Plan F/U with MD apt 06/21/2014.  Continues with stretches to improve flexibility and excursion walking with brace on to improve hip mobility with focus on increasing knee flexion. Continue focus on increasing knee flexion. no lunges or squatting beyond 30 degrees per MD.         Problem List Patient Active Problem List   Diagnosis Date Noted  . Quadriceps  muscle rupture    Ihor Austin, [place]; [place] #[id number] [phone]  Aldona Lento 06/18/2014, 3:17 PM  [facility] [address] [place], [name], [address] Phone: [phone]  Fax:  [phone]

## 2014-06-21 ENCOUNTER — Ambulatory Visit (INDEPENDENT_AMBULATORY_CARE_PROVIDER_SITE_OTHER): Payer: Medicare PPO | Admitting: Orthopedic Surgery

## 2014-06-21 ENCOUNTER — Encounter (HOSPITAL_COMMUNITY): Payer: Medicare HMO | Admitting: Physical Therapy

## 2014-06-21 VITALS — BP 136/88 | Ht 63.0 in | Wt 182.0 lb

## 2014-06-21 DIAGNOSIS — S76111D Strain of right quadriceps muscle, fascia and tendon, subsequent encounter: Secondary | ICD-10-CM | POA: Diagnosis not present

## 2014-06-21 DIAGNOSIS — S76111S Strain of right quadriceps muscle, fascia and tendon, sequela: Secondary | ICD-10-CM | POA: Diagnosis not present

## 2014-06-21 NOTE — Patient Instructions (Signed)
EXTEND THERAPY 4 MORE WEEKS

## 2014-06-22 ENCOUNTER — Encounter: Payer: Self-pay | Admitting: Orthopedic Surgery

## 2014-06-22 ENCOUNTER — Ambulatory Visit (HOSPITAL_COMMUNITY): Payer: Medicare PPO

## 2014-06-22 DIAGNOSIS — Z9889 Other specified postprocedural states: Secondary | ICD-10-CM

## 2014-06-22 DIAGNOSIS — M25661 Stiffness of right knee, not elsewhere classified: Secondary | ICD-10-CM

## 2014-06-22 DIAGNOSIS — R262 Difficulty in walking, not elsewhere classified: Secondary | ICD-10-CM

## 2014-06-22 NOTE — Progress Notes (Signed)
Patient ID: Larry Graves, male   DOB: [birth year], 69 y.o.   MRN: [id number] Chief Complaint  Patient presents with  . Follow-up    Recheck on wound.   The patient had open quadriceps repair and 03/10/2014 is doing well he has a wound over his right knee which is a large scab. There is some surrounding erythema but there is no concern at this point for infection. I debrided some of the wound and placed a bandage over it and asked him to cover as it continues to shed.  We placed him in a wrap hinged knee brace. His knee flexion is approximately 96. I would like him to continue therapy for 4 weeks and return in 4 weeks.

## 2014-06-22 NOTE — Therapy (Signed)
[place], [name], [address] Phone: [phone]   Fax:  [phone]  Physical Therapy Treatment  Patient Details  Name: Larry Graves MRN: [id number] Date of Birth: 11/01/1945 Referring Provider:  Carole Civil, MD  Encounter Date: 06/22/2014      PT End of Session - 06/22/14 1417    Visit Number 16   Number of Visits 30   Date for PT Re-Evaluation 07/11/14   Authorization Type [employer] Medicare   Authorization Time Period Gcode complete on 10th session   Authorization - Visit Number 16   Authorization - Number of Visits 20   PT Start Time 4562   PT Stop Time 1432   PT Time Calculation (min) 44 min   Activity Tolerance Patient tolerated treatment well   Behavior During Therapy [facility] for tasks assessed/performed      Past Medical History  Diagnosis Date  . Hypertension   . Diabetes mellitus without complication   . Heart murmur   . BPH (benign prostatic hyperplasia)   . History of alcoholism     Past Surgical History  Procedure Laterality Date  . Replacement total knee Left 2000?    [facility] in [place], [place]  . Carpal tunnel release Bilateral 1990's    [facility] in [place], [place]  . Nasal septum surgery  1980's    [facility] [place], [place]  . Foot surgery Left 2010?    bunionectomy-[facility] in [place], [place]  . Appendectomy  [facility] in [place], [place]  . Hernia repair  1953?    [facility] in [place], [place]  . Melanoma excision      on face-all done in office   . Quadriceps tendon repair Right 03/10/2014    Procedure: REPAIR QUADRICEP TENDON;  Surgeon: Carole Civil, MD;  Location: [facility];  Service: Orthopedics;  Laterality: Right;    There were no vitals filed for this visit.  Visit Diagnosis:  Knee stiffness, right  Hx of right knee surgery  Difficulty walking      Subjective Assessment - 06/22/14 1353    Subjective Pt stated knee  is a little achey due to the weather, no reports of pain today.  Pt arrived in new brace got from MD reported some removal of scab from wound.  Referral received for another 4 weeks of PT.   Currently in Pain? No/denies   Pain Descriptors / Indicators Aching             [facility] Adult PT Treatment/Exercise - 06/22/14 0001    Knee/Hip Exercises: Stretches   Active Hamstring Stretch 3 reps;30 seconds   Active Hamstring Stretch Limitations 3way to 14"   Quad Stretch 3 reps;30 seconds   Quad Stretch Limitations prone with rope   Piriformis Stretch 3 reps;30 seconds   Piriformis Stretch Limitations supine manual   Gastroc Stretch 3 reps;30 seconds   Gastroc Stretch Limitations slant board   Knee/Hip Exercises: Machines for Strengthening   Cybex Knee Extension 2.5PL 3x 10 start position 9 (position 10)    Cybex Knee Flexion 4.5Pl 1x 10; 5 PL 2x 10   Knee/Hip Exercises: Standing   Lateral Step Up Right;10 reps;Hand Hold: 2;Step Height: 4"   Forward Step Up Right;2 sets;Hand Hold: 1;Step Height: 4";Step Height: 6"   Other Standing Knee Exercises Box walk around 10x each with therapist asssistance for LE positioning.    Other  Standing Knee Exercises Single elg toe touch 3D hip excursions 10x    Knee/Hip Exercises: Supine   Heel Slides 20 reps   Heel Slides Limitations 0-98                  PT Short Term Goals - 06/22/14 1431    PT SHORT TERM GOAL #1   Title patient will demonstrate increased Rt knee flexion to 60 degrees to be able to stand from high chairs without using UEs for support.    Status Achieved   PT SHORT TERM GOAL #2   Title Patient will be able to fully extend knee to normalize stride mechanics of gait.    Status Partially Met   PT SHORT TERM GOAL #3   Title Patient will demonstrate independence with HEP.    Status Achieved   PT SHORT TERM GOAL #4   Title Patient will dmeonstrate increased hip intenernal totion ROM to 25 degrees to improve decelration  mechanics   Status Achieved           PT Long Term Goals - 06/22/14 1431    PT LONG TERM GOAL #1   Title patient will demonstrate increased Rt knee flexion to 90 degrees to be able to stand from typical chair without using UEs for support   Status Achieved   PT LONG TERM GOAL #2   Title patient will demonstrate increased Rt knee flexion to >100 degrees to be able to stand from typical chair without using UEs for support   Status On-going   PT LONG TERM GOAL #3   Title Patient will dmeonstrate 5/5 MMT for Rt knee flexion and extension to ambulate up and down stairs withtou assistance.    Status On-going   PT LONG TERM GOAL #4   Title Patient wll be able to ambulate without assistive device.    Status On-going   PT LONG TERM GOAL #5   Title Patient will dmeonstrate increased hip intenernal rototion ROm to 35 degrees to improve deceleration mechanics   Status On-going               Plan - 06/22/14 1419    Clinical Impression Statement Continued session focus on improving AROM for Rt knee and overall hip mobility.  Overall hip internal rotation is progressing well with increase ease with piriformis stretches today.  Flexion continues to be most limiting with AROM 0-98 degrees.  Strength is progress with ability to increase 1/2 plate with quad and hamstring machines.   PT Next Visit Plan Continues with stretches to improve flexibility and excursion walking with brace on to improve hip mobility with focus on increasing knee flexion. Continue focus on increasing knee flexion. no lunges or squatting beyond 30 degrees per MD.         Problem List Patient Active Problem List   Diagnosis Date Noted  . Quadriceps muscle rupture    Ihor Austin, [place]; [place] #[id number] [phone]  Aldona Lento 06/22/2014, 2:33 PM  [place] [address] [place], [name], [address] Phone: [phone]   Fax:  [phone]

## 2014-06-23 ENCOUNTER — Ambulatory Visit (HOSPITAL_COMMUNITY): Payer: Medicare PPO | Admitting: Physical Therapy

## 2014-06-23 DIAGNOSIS — Z9889 Other specified postprocedural states: Secondary | ICD-10-CM

## 2014-06-23 DIAGNOSIS — M25661 Stiffness of right knee, not elsewhere classified: Secondary | ICD-10-CM | POA: Diagnosis not present

## 2014-06-23 DIAGNOSIS — R262 Difficulty in walking, not elsewhere classified: Secondary | ICD-10-CM

## 2014-06-23 NOTE — Therapy (Signed)
[place], [name], [address] Phone: [phone]   Fax:  [phone]  Physical Therapy Treatment  Patient Details  Name: Larry Graves MRN: [id number] Date of Birth: 1945/10/14 Referring Provider:  Carole Civil, MD  Encounter Date: 06/23/2014      PT End of Session - 06/23/14 1520    Visit Number 17   Number of Visits 30   Date for PT Re-Evaluation 07/11/14   Authorization Type [employer] Medicare   Authorization Time Period Gcode complete on 10th session   Authorization - Visit Number 17   Authorization - Number of Visits 20   PT Start Time 1518   PT Stop Time 1600   PT Time Calculation (min) 42 min   Activity Tolerance Patient tolerated treatment well   Behavior During Therapy [facility] for tasks assessed/performed      Past Medical History  Diagnosis Date  . Hypertension   . Diabetes mellitus without complication   . Heart murmur   . BPH (benign prostatic hyperplasia)   . History of alcoholism     Past Surgical History  Procedure Laterality Date  . Replacement total knee Left 2000?    [facility] in [place], [place]  . Carpal tunnel release Bilateral 1990's    [facility] in [place], [place]  . Nasal septum surgery  1980's    [facility] [place], [place]  . Foot surgery Left 2010?    bunionectomy-[facility] in [place], [place]  . Appendectomy  [facility] in [place], [place]  . Hernia repair  1953?    [facility] in [place], [place]  . Melanoma excision      on face-all done in office   . Quadriceps tendon repair Right 03/10/2014    Procedure: REPAIR QUADRICEP TENDON;  Surgeon: Carole Civil, MD;  Location: [facility];  Service: Orthopedics;  Laterality: Right;    There were no vitals filed for this visit.  Visit Diagnosis:  No diagnosis found.      Subjective Assessment - 06/22/14 1353    Subjective Pt stated knee is a little achey due to the weather, no reports of  pain today.  Pt arrived in new brace got from MD reported some removal of scab from wound.  Referral received for another 4 weeks of PT.   Currently in Pain? No/denies   Pain Descriptors / Indicators Aching                       [facility] Adult PT Treatment/Exercise - 06/23/14 0001    Knee/Hip Exercises: Stretches   Active Hamstring Stretch 2 reps;20 seconds   Active Hamstring Stretch Limitations 3way to 6"   Quad Stretch 3 reps;30 seconds   Quad Stretch Limitations prone with rope   Knee: Self-Stretch Limitations Knee driver to 12" 81W 3 second hold 3 ways   Knee/Hip Exercises: Machines for Strengthening   Cybex Knee Extension 2.5PL 2x 10 start position 9 (position 10)z 3rd set with 3 Pl   Cybex Knee Flexion 4.5Pl 1x 10; 5 PL 2x 10   Knee/Hip Exercises: Standing   Heel Raises 10 reps;2 sets   Heel Raises Limitations on slant board   Lateral Step Up Right;10 reps;Hand Hold: 2;Step Height: 6"   Forward Step Up Right;2 sets;Hand Hold: 1;Step Height: 6"   Functional Squat Limitations Mini squat matrix 5x 7 sets    Other Standing  Knee Exercises Single leg toe touch 3D hip excursions 10x                   PT Short Term Goals - 06/22/14 1431    PT SHORT TERM GOAL #1   Title patient will demonstrate increased Rt knee flexion to 60 degrees to be able to stand from high chairs without using UEs for support.    Status Achieved   PT SHORT TERM GOAL #2   Title Patient will be able to fully extend knee to normalize stride mechanics of gait.    Status Partially Met   PT SHORT TERM GOAL #3   Title Patient will demonstrate independence with HEP.    Status Achieved   PT SHORT TERM GOAL #4   Title Patient will dmeonstrate increased hip intenernal totion ROM to 25 degrees to improve decelration mechanics   Status Achieved           PT Long Term Goals - 06/22/14 1431    PT LONG TERM GOAL #1   Title patient will demonstrate increased Rt knee flexion to 90 degrees to be  able to stand from typical chair without using UEs for support   Status Achieved   PT LONG TERM GOAL #2   Title patient will demonstrate increased Rt knee flexion to >100 degrees to be able to stand from typical chair without using UEs for support   Status On-going   PT LONG TERM GOAL #3   Title Patient will dmeonstrate 5/5 MMT for Rt knee flexion and extension to ambulate up and down stairs withtou assistance.    Status On-going   PT LONG TERM GOAL #4   Title Patient wll be able to ambulate without assistive device.    Status On-going   PT LONG TERM GOAL #5   Title Patient will dmeonstrate increased hip intenernal rototion ROm to 35 degrees to improve deceleration mechanics   Status On-going               Plan - 06/23/14 1552    Clinical Impression Statement Session continued strength training exercises despite patient noting minor soreness from yestrerday session. Patient demosntrated good performance with all exercises. Mini squats introduced to begin functionally increasing hip and knee stability so patient can sit to stand withtou UE assistance. no pain noted except at end range of motion. introduced half kneeling onto Rt knee so patient can work on activities at floor level,    PT Next [place] with stretches to improve flexibility and excursion walking with brace on to improve hip mobility with focus on increasing knee flexion. Continue focus on increasing knee flexion. no lunges or squatting beyond 30 degrees per MD.         Problem List Patient Active Problem List   Diagnosis Date Noted  . Quadriceps muscle rupture    Devona Konig PT DPT [place], [name], [address] Phone: [phone]   Fax:  [phone]

## 2014-06-25 ENCOUNTER — Ambulatory Visit (HOSPITAL_COMMUNITY): Payer: Medicare PPO | Admitting: Physical Therapy

## 2014-06-25 DIAGNOSIS — M25661 Stiffness of right knee, not elsewhere classified: Secondary | ICD-10-CM

## 2014-06-25 DIAGNOSIS — Z9889 Other specified postprocedural states: Secondary | ICD-10-CM

## 2014-06-25 DIAGNOSIS — R262 Difficulty in walking, not elsewhere classified: Secondary | ICD-10-CM

## 2014-06-25 NOTE — Therapy (Signed)
[place], [name], [address] Phone: [phone]   Fax:  [phone]  Physical Therapy Treatment  Patient Details  Name: Larry Graves MRN: [id number] Date of Birth: [birth year] Referring Provider:  Carole Civil, MD  Encounter Date: 06/25/2014      PT End of Session - 06/25/14 1622    Visit Number 18   Number of Visits 30   Date for PT Re-Evaluation 07/11/14   Authorization Type [employer] Medicare   Authorization Time Period Gcode complete on 10th session   Authorization - Visit Number 18   Authorization - Number of Visits 20   PT Start Time 1603   PT Stop Time 1645   PT Time Calculation (min) 42 min   Activity Tolerance Patient tolerated treatment well   Behavior During Therapy [facility] for tasks assessed/performed      Past Medical History  Diagnosis Date  . Hypertension   . Diabetes mellitus without complication   . Heart murmur   . BPH (benign prostatic hyperplasia)   . History of alcoholism     Past Surgical History  Procedure Laterality Date  . Replacement total knee Left 2000?    [facility] in [place], [place]  . Carpal tunnel release Bilateral 1990's    [facility] in [place], [place]  . Nasal septum surgery  1980's    [facility] in [place], [place]  . Foot surgery Left 2010?    [facility]unionectomy-[facility] in [place], [place]  . Appendectomy  [facility] in [place], [place]  . Hernia repair  1953?    [facility] in [place], [place]  . Melanoma excision      on face-all done in office   . Quadriceps tendon repair Right 03/10/2014    Procedure: REPAIR QUADRICEP TENDON;  Surgeon: Carole Civil, MD;  Location: [facility];  Service: Orthopedics;  Laterality: Right;    There were no vitals filed for this visit.  Visit Diagnosis:  Knee stiffness, right  Hx of right knee surgery  Difficulty walking      Subjective Assessment - 06/25/14 1609    Subjective patient states  no pain recently notes increased soreness fo,lowing last session hoguh no true pain. patient states he feels like scar is almost about to come off.    Currently in Pain? No/denies                       [facility] Adult PT Treatment/Exercise - 06/25/14 0001    Knee/Hip Exercises: Stretches   Active Hamstring Stretch 2 reps;20 seconds   Active Hamstring Stretch Limitations 3way to 6"   Quad Stretch 3 reps;30 seconds   Quad Stretch Limitations prone with rope   Knee: Self-Stretch Limitations Knee driver to 12" 16F 3 second hold 3 ways   Gastroc Stretch Limitations 10x 3 seconds on floor.    Knee/Hip Exercises: Machines for Strengthening   Cybex Knee Extension 3PL 2x 10 start position 9 (position 10)z 3rd set with 3 Pl   Cybex Knee Flexion 4.5Pl 1x 10; 5 PL 2x 10   Knee/Hip Exercises: Standing   Heel Raises 10 reps;2 sets   Heel Raises Limitations on slant board   Lateral Step Up Right;10 reps;Hand Hold: 2;Step Height: 8"   Forward Step Up Right;2 sets;Hand Hold: 1;Step Height: 6";Step Height: 8";10 reps   Functional Squat Limitations Mini squat matrix 5x 7 sets  Other Standing Knee Exercises Single leg toe touch 3D hip excursions 10x    Manual Therapy   Manual Therapy Other (comment)   Other Manual Therapy soft tissue mobilization of quadriceps and vastus lateralis                  PT Short Term Goals - 06/22/14 1431    PT SHORT TERM GOAL #1   Title patient will demonstrate increased Rt knee flexion to 60 degrees to be able to stand from high chairs without using UEs for support.    Status Achieved   PT SHORT TERM GOAL #2   Title Patient will be able to fully extend knee to normalize stride mechanics of gait.    Status Partially Met   PT SHORT TERM GOAL #3   Title Patient will demonstrate independence with HEP.    Status Achieved   PT SHORT TERM GOAL #4   Title Patient will dmeonstrate increased hip intenernal totion ROM to 25 degrees to improve decelration  mechanics   Status Achieved           PT Long Term Goals - 06/22/14 1431    PT LONG TERM GOAL #1   Title patient will demonstrate increased Rt knee flexion to 90 degrees to be able to stand from typical chair without using UEs for support   Status Achieved   PT LONG TERM GOAL #2   Title patient will demonstrate increased Rt knee flexion to >100 degrees to be able to stand from typical chair without using UEs for support   Status On-going   PT LONG TERM GOAL #3   Title Patient will dmeonstrate 5/5 MMT for Rt knee flexion and extension to ambulate up and down stairs withtou assistance.    Status On-going   PT LONG TERM GOAL #4   Title Patient wll be able to ambulate without assistive device.    Status On-going   PT LONG TERM GOAL #5   Title Patient will dmeonstrate increased hip intenernal rototion ROm to 35 degrees to improve deceleration mechanics   Status On-going               Plan - 06/25/14 1626    Clinical Impression Statement Session continued strength training exercises despite patient noting minor soreness from last session. Patient demosntrated good performance with all exercises. Mini squats continued to functionally increase hip and knee stability so patient can sit to stand withtou UE assistancepatient making good improvinments in gait. Noted monor stiffness that improved in Rt quadriceps following soft tissue mobilization        Problem List Patient Active Problem List   Diagnosis Date Noted  . Quadriceps muscle rupture    Devona Konig PT DPT [place], [name], [address] Phone: [phone]   Fax:  [phone]

## 2014-06-30 ENCOUNTER — Ambulatory Visit (HOSPITAL_COMMUNITY): Payer: Medicare PPO

## 2014-07-05 ENCOUNTER — Ambulatory Visit: Payer: Medicare PPO | Admitting: Orthopedic Surgery

## 2014-07-06 ENCOUNTER — Telehealth: Payer: Self-pay | Admitting: Orthopedic Surgery

## 2014-07-06 NOTE — Telephone Encounter (Signed)
Patient is aware and is scheduled tomorrow

## 2014-07-06 NOTE — Telephone Encounter (Signed)
i need to see it tomorrow morning

## 2014-07-06 NOTE — Telephone Encounter (Signed)
ROUTING TO DR HARRISON 

## 2014-07-06 NOTE — Telephone Encounter (Signed)
Patient states that the wound on his knee is not healing, he states that he can keep it covered and it will start to heal, but when he starts to do therapy or if he keeps it covered for a period of time it gets moisture underneath the bandage and it starts all over again looking like it did in the beginning, he is not sure what to do at this point, please advise?

## 2014-07-07 ENCOUNTER — Telehealth (HOSPITAL_COMMUNITY): Payer: Self-pay | Admitting: Physical Therapy

## 2014-07-07 ENCOUNTER — Ambulatory Visit (INDEPENDENT_AMBULATORY_CARE_PROVIDER_SITE_OTHER): Payer: Medicare PPO | Admitting: Orthopedic Surgery

## 2014-07-07 ENCOUNTER — Ambulatory Visit (HOSPITAL_COMMUNITY): Payer: Medicare PPO | Admitting: Physical Therapy

## 2014-07-07 ENCOUNTER — Encounter: Payer: Self-pay | Admitting: Orthopedic Surgery

## 2014-07-07 VITALS — BP 153/87 | Ht 63.0 in | Wt 182.0 lb

## 2014-07-07 DIAGNOSIS — T8131XD Disruption of external operation (surgical) wound, not elsewhere classified, subsequent encounter: Secondary | ICD-10-CM | POA: Diagnosis not present

## 2014-07-07 DIAGNOSIS — S76111S Strain of right quadriceps muscle, fascia and tendon, sequela: Secondary | ICD-10-CM

## 2014-07-07 NOTE — Progress Notes (Signed)
Chief Complaint  Patient presents with  . Follow-up    Wound check on right knee, DOS 03-10-14, Quad repair.    The patient has healed his quadriceps tendon repair he has a superficial wound 4 x 2 with eschar and fibrinous exudate. We brought him in as he was concerned he was seeing bleeding.  We debrided the wound we got good bleeding tissue and we will repeat the process tomorrow. We put a wet-to-dry dressing on it and asked him to stop physical therapy into the soft tissues heal.

## 2014-07-07 NOTE — Telephone Encounter (Signed)
Patient saw Dr. Aline Brochure today, wound was scraped and cut by MD to help with healing process. Dr. Aline Brochure advised pt cx PT today. Patient will see Dr. Aline Brochure again tomorrow.

## 2014-07-08 ENCOUNTER — Ambulatory Visit (INDEPENDENT_AMBULATORY_CARE_PROVIDER_SITE_OTHER): Payer: Medicare PPO | Admitting: Orthopedic Surgery

## 2014-07-08 VITALS — BP 158/90 | Ht 63.0 in | Wt 182.0 lb

## 2014-07-08 DIAGNOSIS — S76111S Strain of right quadriceps muscle, fascia and tendon, sequela: Secondary | ICD-10-CM | POA: Diagnosis not present

## 2014-07-08 DIAGNOSIS — T8131XD Disruption of external operation (surgical) wound, not elsewhere classified, subsequent encounter: Secondary | ICD-10-CM

## 2014-07-08 NOTE — Progress Notes (Signed)
Patient ID: Larry Graves, male   DOB: 17-Nov-1945, 69 y.o.   MRN: [id number]   No chief complaint on file.   Chief complaint is wound right leg  History Status post proximal tendon repair right leg Recheck right knee for debridement of wound. After 1 days debridement and wet-to-dry dressing his wound bed looks very good he has an excellent amount of granulation tissue   we debrided some fibrous tissue.  Wet-to-dry dressing applied  He will continue wet-to-dry dressings over the weekend and I will re-debride him on Monday

## 2014-07-12 ENCOUNTER — Ambulatory Visit (INDEPENDENT_AMBULATORY_CARE_PROVIDER_SITE_OTHER): Payer: Medicare PPO | Admitting: Orthopedic Surgery

## 2014-07-12 ENCOUNTER — Encounter (HOSPITAL_COMMUNITY): Payer: Medicare PPO | Admitting: Physical Therapy

## 2014-07-12 DIAGNOSIS — T8131XD Disruption of external operation (surgical) wound, not elsewhere classified, subsequent encounter: Secondary | ICD-10-CM | POA: Diagnosis not present

## 2014-07-12 NOTE — Progress Notes (Signed)
Follow-up note.cc  Chief Complaint  Patient presents with  . Wound Check    Right knee wound check follow-up    Wound check right knee status post quadriceps repair patient is now doing wet-to-dry dressing changes wound looks good and his wife did a good job over the weekend note further debridement is needed he has good granulation tissue the wound is contracting follow-up in a week continue current wet-to-dry dressings

## 2014-07-14 ENCOUNTER — Encounter (HOSPITAL_COMMUNITY): Payer: Medicare PPO | Admitting: Physical Therapy

## 2014-07-16 ENCOUNTER — Encounter (HOSPITAL_COMMUNITY): Payer: Medicare PPO | Admitting: Physical Therapy

## 2014-07-19 ENCOUNTER — Telehealth (HOSPITAL_COMMUNITY): Payer: Self-pay | Admitting: Physical Therapy

## 2014-07-19 ENCOUNTER — Encounter (HOSPITAL_COMMUNITY): Payer: Medicare PPO | Admitting: Physical Therapy

## 2014-07-19 ENCOUNTER — Ambulatory Visit (INDEPENDENT_AMBULATORY_CARE_PROVIDER_SITE_OTHER): Payer: Medicare PPO | Admitting: Orthopedic Surgery

## 2014-07-19 VITALS — BP 156/94 | Ht 63.0 in | Wt 182.0 lb

## 2014-07-19 DIAGNOSIS — T8131XD Disruption of external operation (surgical) wound, not elsewhere classified, subsequent encounter: Secondary | ICD-10-CM

## 2014-07-19 NOTE — Progress Notes (Addendum)
Chief Complaint  Patient presents with  . Follow-up    1 week recheck right knee wound   Encounter Diagnosis  Name Primary?  . Postoperative wound breakdown, subsequent encounter Yes    The wound continues to close and is now 40 cm long(correction 4.0 cm long)  by 1 cm at its widest point. Granulation bed is excellent  Continue current care no therapy follow-up in a week we should resume therapy most likely after that

## 2014-07-19 NOTE — Telephone Encounter (Signed)
MD said for pt not to take PT this week

## 2014-07-20 ENCOUNTER — Ambulatory Visit: Payer: Medicare PPO | Admitting: Orthopedic Surgery

## 2014-07-21 ENCOUNTER — Encounter (HOSPITAL_COMMUNITY): Payer: Medicare PPO | Admitting: Physical Therapy

## 2014-07-22 ENCOUNTER — Encounter (HOSPITAL_COMMUNITY): Payer: Medicare PPO | Admitting: Physical Therapy

## 2014-07-26 ENCOUNTER — Ambulatory Visit (INDEPENDENT_AMBULATORY_CARE_PROVIDER_SITE_OTHER): Payer: Medicare PPO | Admitting: Orthopedic Surgery

## 2014-07-26 ENCOUNTER — Ambulatory Visit (HOSPITAL_COMMUNITY): Payer: Medicare PPO | Attending: Orthopedic Surgery | Admitting: Physical Therapy

## 2014-07-26 VITALS — Ht 63.0 in | Wt 182.0 lb

## 2014-07-26 DIAGNOSIS — M25661 Stiffness of right knee, not elsewhere classified: Secondary | ICD-10-CM

## 2014-07-26 DIAGNOSIS — R262 Difficulty in walking, not elsewhere classified: Secondary | ICD-10-CM

## 2014-07-26 DIAGNOSIS — T8131XD Disruption of external operation (surgical) wound, not elsewhere classified, subsequent encounter: Secondary | ICD-10-CM

## 2014-07-26 DIAGNOSIS — Z9889 Other specified postprocedural states: Secondary | ICD-10-CM

## 2014-07-26 DIAGNOSIS — S76111S Strain of right quadriceps muscle, fascia and tendon, sequela: Secondary | ICD-10-CM

## 2014-07-26 NOTE — Therapy (Signed)
[place], [name], [address] Phone: [phone]   Fax:  [phone]  Physical Therapy Treatment (Re-Assessment)  Patient Details  Name: Larry Graves MRN: [id number] Date of Birth: 08-Apr-1945 Referring Provider:  Delphina Cahill, MD  Encounter Date: 07/26/2014      PT End of Session - 07/26/14 1626    Visit Number 19   Number of Visits 30   Date for PT Re-Evaluation 08/23/14   Authorization Type [employer] Medicare   Authorization Time Period G-code complete 19th session   Authorization - Visit Number 20   Authorization - Number of Visits 20   Activity Tolerance Patient tolerated treatment well   Behavior During Therapy [facility] for tasks assessed/performed      Past Medical History  Diagnosis Date  . Hypertension   . Diabetes mellitus without complication   . Heart murmur   . BPH (benign prostatic hyperplasia)   . History of alcoholism     Past Surgical History  Procedure Laterality Date  . Replacement total knee Left 2000?    [facility] in [place], [place]  . Carpal tunnel release Bilateral 1990's    [facility] in [place], [place]  . Nasal septum surgery  1980's    [facility] [place], [place]  . Foot surgery Left 2010?    bunionectomy-[facility] in [place], [place]  . Appendectomy  [place] in [place], [place]  . Hernia repair  1953?    [facility] in [place], [place]  . Melanoma excision      on face-all done in office   . Quadriceps tendon repair Right 03/10/2014    Procedure: REPAIR QUADRICEP TENDON;  Surgeon: Carole Civil, MD;  Location: [facility];  Service: Orthopedics;  Laterality: Right;    There were no vitals filed for this visit.  Visit Diagnosis:  Knee stiffness, right - Plan: PT plan of care cert/re-cert  Hx of right knee surgery - Plan: PT plan of care cert/re-cert  Difficulty walking - Plan: PT plan of care cert/re-cert      Subjective Assessment -  07/26/14 1518    Subjective P   How long can you sit comfortably? 5/16- unlimited    How long can you stand comfortably? 5/16- unlimed    How long can you walk comfortably? 5/16- at least one hour with no assistive device    Patient Stated Goals bowling,fishing, ellyptical, hunting, to be bale to walk daily for 1 hour or greater.    Currently in Pain? No/denies            [facility] PT Assessment - 07/26/14 0001    Assessment   Medical Diagnosis Rt patellar tendon repair.    Onset Date 08/11/14   Next MD Visit Aline Brochure 06/21/2014   Prior Therapy no   Precautions   Precaution Comments Diabetes, good ciculation.    Balance Screen   Has the patient fallen in the past 6 months Yes   How many times? fall that caused initial injury    Has the patient had a decrease in activity level because of a fear of falling?  Yes   Is the patient reluctant to leave their home because of a fear of falling?  No   Prior Function   Level of Independence Independent with gait;Independent with transfers;Independent with basic ADLs   Vocation Retired   Leisure fishing, camping, hunting, 4-wheeling    Observation/Other Assessments  Focus on Therapeutic Outcomes (FOTO)  28% limited    Posture/Postural Control   Posture Comments forward head, rounded shoulders, flat T-spine, increased lumbar lordosis, slight varus knees    AROM   Right Hip External Rotation  43   Right Hip Internal Rotation  25   Left Hip External Rotation  43   Left Hip Internal Rotation  28   Right Knee Extension 2   Right Knee Flexion 100   Strength   Right Hip Flexion 4/5   Right Hip Extension 4/5   Right Hip ABduction 4/5   Left Hip Flexion 5/5   Left Hip Extension 4/5   Left Hip ABduction 4+/5   Right Knee Flexion 4/5   Right Knee Extension 4+/5   Left Knee Flexion 5/5   Left Knee Extension 5/5   Right Ankle Dorsiflexion 5/5   Left Ankle Dorsiflexion 5/5   Ambulation/Gait   Gait Comments proximal muscle weakness, reduced  DF, reduced rotation pelvis and trunk, reduced gait speed, bilateral hip ER                      [facility] Adult PT Treatment/Exercise - 07/26/14 0001    Knee/Hip Exercises: Stretches   Active Hamstring Stretch 2 reps;30 seconds   Active Hamstring Stretch Limitations 14 inch box    Quad Stretch 2 reps;30 seconds   Quad Stretch Limitations prone with rope    Gastroc Stretch 3 reps;30 seconds   Gastroc Stretch Limitations slantboard    Knee/Hip Exercises: Standing   Functional Squat 2 sets;10 reps   Functional Squat Limitations toes neutral and toes out    Other Standing Knee Exercises 3D hip excursions 1x10                PT Education - 07/26/14 1614    Education provided Yes   Education Details plan of care moving forward, prognosis, adjusting exercise/HEP due to recent inactivity    Person(s) Educated Patient   Methods Explanation   Comprehension Verbalized understanding          PT Short Term Goals - 07/26/14 1628    PT SHORT TERM GOAL #1   Title patient will demonstrate increased Rt knee flexion to 60 degrees to be able to stand from high chairs without using UEs for support.    Baseline 05/27/2014 82 degrees flexion, able to STS from 22in chair without HHA   Time 2   Period Weeks   Status Achieved   PT SHORT TERM GOAL #2   Title Patient will be able to fully extend knee to normalize stride mechanics of gait.    Baseline 06/18/2014 0-98 degrees   Time 3   Period Weeks   Status Achieved   PT SHORT TERM GOAL #3   Title Patient will demonstrate independence with HEP.    Baseline Reports daily   Time 3   Period Weeks   Status Achieved   PT SHORT TERM GOAL #4   Title Patient will dmeonstrate increased hip intenernal totion ROM to 25 degrees to improve decelration mechanics   Time 3   Period Weeks   Status Achieved           PT Long Term Goals - 07/26/14 1628    PT LONG TERM GOAL #1   Title patient will demonstrate increased Rt knee flexion  to 90 degrees to be able to stand from typical chair without using UEs for support   Baseline 06/18/2014 AROM 0-98 degrees  Time 4   Period Weeks   Status Achieved   PT LONG TERM GOAL #2   Title patient will demonstrate increased Rt knee flexion to >100 degrees to be able to stand from typical chair without using UEs for support   Time 6   Period Weeks   Status On-going   PT LONG TERM GOAL #3   Title Patient will dmeonstrate 5/5 MMT for Rt knee flexion and extension to ambulate up and down stairs withtou assistance.    Time 6   Period Weeks   Status On-going   PT LONG TERM GOAL #4   Title Patient wll be able to ambulate without assistive device.    Time 6   Period Weeks   Status Achieved   PT LONG TERM GOAL #5   Title Patient will dmeonstrate increased hip intenernal rototion ROm to 35 degrees to improve deceleration mechanics   Time 6   Period Weeks   Status On-going               Plan - 07-27-14 1626    Clinical Impression Statement Re-Asssessment performed today;  patient returning to skilled PT services after MD hold for wound care done by MD. Patient very motivated to continue therapy and states that his main concerns are this time are his range of motion and his strength. Main deficits at this time also appear to be range of motion, strength, gait deviations, and reduced functional task performance. The patient will benefit from at least 4 to 6 more weeks of skilled PT services, at 3x/week, with primary focus on enhancing knee ROM, knee strength and stability, and improving functional task performance skills so that he may reach an optimal level of function. Re-initiated functional stretches and exercises today, remaining within prior protocols sent by MD; PT to contact MD to identify any updates to protocol.    Pt will benefit from skilled therapeutic intervention in order to improve on the following deficits Abnormal gait;Decreased endurance;Improper body  mechanics;Impaired flexibility;Decreased strength;Difficulty walking;Pain;Decreased range of motion   Rehab Potential Good   PT Frequency 3x / week   PT Duration Other (comment)  4-6 weeks    PT Treatment/Interventions ADLs/Self Care Home Management;Gait training;Neuromuscular re-education;Stair training;Functional mobility training;Patient/family education;Passive range of motion;Therapeutic activities;Therapeutic exercise;Manual techniques;Balance training   PT Next Visit Plan Continues with stretches to improve flexibility and excursion walking with brace on to improve hip mobility with focus on increasing knee flexion. Continue focus on increasing knee flexion. no lunges or squatting beyond 30 degrees per MD.    PT Home Exercise Plan Calf, hamstring, hip flexor stetch with anterior knee driver    Consulted and Agree with Plan of Care Patient          G-Codes - Jul 27, 2014 1619    Functional Assessment Tool Used FOTO 28% limited    Functional Limitation Mobility: Walking and moving around   Mobility: Walking and Moving Around Current Status [phone]) At least 20 percent but less than 40 percent impaired, limited or restricted   Mobility: Walking and Moving Around Goal Status [phone]) At least 1 percent but less than 20 percent impaired, limited or restricted      Problem List Patient Active Problem List   Diagnosis Date Noted  . Quadriceps muscle rupture     Deniece Ree PT, DPT [facility] [address] [place], [name], [address] Phone: [phone]   Fax:  [phone]

## 2014-07-26 NOTE — Progress Notes (Signed)
Patient ID: Larry Graves, male   DOB: August 28, 1945, 69 y.o.   MRN: [id number] Chief Complaint  Patient presents with  . Follow-up    1 week follow up Right knee wound    The wound is now 2 cm long by 1 cm wide, excellent granulating bed, return to therapy return in about 2 weeks on the first wound check

## 2014-07-28 ENCOUNTER — Ambulatory Visit (HOSPITAL_COMMUNITY): Payer: Medicare PPO | Admitting: Physical Therapy

## 2014-07-28 DIAGNOSIS — M25661 Stiffness of right knee, not elsewhere classified: Secondary | ICD-10-CM

## 2014-07-28 DIAGNOSIS — Z9889 Other specified postprocedural states: Secondary | ICD-10-CM

## 2014-07-28 DIAGNOSIS — R262 Difficulty in walking, not elsewhere classified: Secondary | ICD-10-CM

## 2014-07-28 NOTE — Therapy (Signed)
[facility] [address] [place], [name], [address] Phone: [phone]   Fax:  [phone]  Physical Therapy Treatment  Patient Details  Name: Larry Graves MRN: [id number] Date of Birth: 1945/08/23 Referring Provider:  Carole Civil, MD  Encounter Date: 07/28/2014      PT End of Session - 07/28/14 1527    Visit Number 20   Number of Visits 30   Date for PT Re-Evaluation 08/23/14   Authorization Type [employer] Medicare   Authorization Time Period G-code complete 19th session   Authorization - Visit Number 62   Authorization - Number of Visits 29   PT Start Time 1516   PT Stop Time 1600   PT Time Calculation (min) 44 min   Activity Tolerance Patient tolerated treatment well   Behavior During Therapy [facility] for tasks assessed/performed      Past Medical History  Diagnosis Date  . Hypertension   . Diabetes mellitus without complication   . Heart murmur   . BPH (benign prostatic hyperplasia)   . History of alcoholism     Past Surgical History  Procedure Laterality Date  . Replacement total knee Left 2000?    [facility] in [place], [place]  . Carpal tunnel release Bilateral 1990's    [facility] in [place], [place]  . Nasal septum surgery  1980's    [facility] [place], [place]  . Foot surgery Left 2010?    [facility]unionectomy-[facility] in [place], [place]  . Appendectomy  [place] in [place], [place]  . Hernia repair  1953?    [facility] in [place], [place]  . Melanoma excision      on face-all done in office   . Quadriceps tendon repair Right 03/10/2014    Procedure: REPAIR QUADRICEP TENDON;  Surgeon: Carole Civil, MD;  Location: [facility];  Service: Orthopedics;  Laterality: Right;    There were no vitals filed for this visit.  Visit Diagnosis:  Knee stiffness, right  Hx of right knee surgery  Difficulty walking      Subjective Assessment - 07/28/14 1521    Subjective Patient states  beginning the ellyptical again has been good with improving his endurance as he notes he gets winded after 10 minutes which used to take 70 minutes   Currently in Pain? No/denies           [facility] Adult PT Treatment/Exercise - 07/28/14 0001    Knee/Hip Exercises: Stretches   Active Hamstring Stretch 30 seconds;3 reps   Active Hamstring Stretch Limitations 14 inch box    Quad Stretch 30 seconds;4 reps   Quad Stretch Limitations prone with rope    Knee: Self-Stretch Limitations Knee driver to 12" 71I 3 second hold 3 ways   Gastroc Stretch 3 reps;30 seconds   Gastroc Stretch Limitations slantboard    Knee/Hip Exercises: Standing   Forward Lunges 10 reps;2 sets;Both   Forward Lunges Limitations 1 set static, 1 set dynamic   Side Lunges 10 reps   Lateral Step Up Step Height: 8";10 reps   Forward Step Up Step Height: 8";10 reps   Functional Squat Limitations sit to stand 10x 2 sets, minisquat with overhead press 10x 5lb dumbbell.    Other Standing Knee Exercises Box walk around 10x each with therapist asssistance for LE positioning.    Other Standing Knee Exercises Sumo walk green Tband 2RT  PT Short Term Goals - 07/26/14 1628    PT SHORT TERM GOAL #1   Title patient will demonstrate increased Rt knee flexion to 60 degrees to be able to stand from high chairs without using UEs for support.    Baseline 05/27/2014 82 degrees flexion, able to STS from 22in chair without HHA   Time 2   Period Weeks   Status Achieved   PT SHORT TERM GOAL #2   Title Patient will be able to fully extend knee to normalize stride mechanics of gait.    Baseline 06/18/2014 0-98 degrees   Time 3   Period Weeks   Status Achieved   PT SHORT TERM GOAL #3   Title Patient will demonstrate independence with HEP.    Baseline Reports daily   Time 3   Period Weeks   Status Achieved   PT SHORT TERM GOAL #4   Title Patient will dmeonstrate increased hip intenernal totion ROM to 25 degrees to improve  decelration mechanics   Time 3   Period Weeks   Status Achieved           PT Long Term Goals - 07/26/14 1628    PT LONG TERM GOAL #1   Title patient will demonstrate increased Rt knee flexion to 90 degrees to be able to stand from typical chair without using UEs for support   Baseline 06/18/2014 AROM 0-98 degrees   Time 4   Period Weeks   Status Achieved   PT LONG TERM GOAL #2   Title patient will demonstrate increased Rt knee flexion to >100 degrees to be able to stand from typical chair without using UEs for support   Time 6   Period Weeks   Status On-going   PT LONG TERM GOAL #3   Title Patient will dmeonstrate 5/5 MMT for Rt knee flexion and extension to ambulate up and down stairs withtou assistance.    Time 6   Period Weeks   Status On-going   PT LONG TERM GOAL #4   Title Patient wll be able to ambulate without assistive device.    Time 6   Period Weeks   Status Achieved   PT LONG TERM GOAL #5   Title Patient will dmeonstrate increased hip intenernal rototion ROm to 35 degrees to improve deceleration mechanics   Time 6   Period Weeks   Status On-going               Plan - 07/28/14 1528    Clinical Impression Statement Patient's MD states no squatting greater than 45 degrees due to knee scar still healing. Session focused on maintenance of ROM and increasign RRt knee flexion htrough pain free stretching with a secondary focus on increasign LE strngth to normalize gait mechanincs and activity tolerance.   PT Next Visit Plan Continues with stretches to improve flexibility and excursion walking with brace on to improve hip mobility with focus on increasing knee flexion. Continue focus on increasing knee flexion. no lunges or squatting beyond 30 degrees per MD.         Problem List Patient Active Problem List   Diagnosis Date Noted  . Quadriceps muscle rupture    Devona Konig PT DPT [facility] [place], [name], [address] Phone: [phone]   Fax:  [phone]

## 2014-07-30 ENCOUNTER — Ambulatory Visit (HOSPITAL_COMMUNITY): Payer: Medicare PPO | Admitting: Physical Therapy

## 2014-07-30 DIAGNOSIS — M25661 Stiffness of right knee, not elsewhere classified: Secondary | ICD-10-CM | POA: Diagnosis not present

## 2014-07-30 DIAGNOSIS — Z9889 Other specified postprocedural states: Secondary | ICD-10-CM

## 2014-07-30 DIAGNOSIS — R262 Difficulty in walking, not elsewhere classified: Secondary | ICD-10-CM

## 2014-07-30 NOTE — Therapy (Signed)
[facility] [address] [place], [name], [address] Phone: [phone]   Fax:  [phone]  Physical Therapy Treatment  Patient Details  Name: Larry Graves MRN: [id number] Date of Birth: [birth year] Referring Provider:  Carole Civil, MD  Encounter Date: 07/30/2014      PT End of Session - 07/30/14 1522    Visit Number 21   Number of Visits 30   Date for PT Re-Evaluation 08/23/14   Authorization Type [employer] Medicare   Authorization Time Period G-code complete 19th session   Authorization - Visit Number 21   Authorization - Number of Visits 29   PT Start Time 1519   PT Stop Time 1602   PT Time Calculation (min) 43 min   Activity Tolerance Patient tolerated treatment well   Behavior During Therapy [facility] for tasks assessed/performed      Past Medical History  Diagnosis Date  . Hypertension   . Diabetes mellitus without complication   . Heart murmur   . BPH (benign prostatic hyperplasia)   . History of alcoholism     Past Surgical History  Procedure Laterality Date  . Replacement total knee Left 2000?    [facility] in [place], [place]  . Carpal tunnel release Bilateral 1990's    [facility] in [place], [place]  . Nasal septum surgery  1980's    [facility] in [place], [place]  . Foot surgery Left 2010?    [facility]unionectomy-[facility] in [place], [place]  . Appendectomy  [place] in [place], [place]  . Hernia repair  1953?    [facility] in [place], [place]  . Melanoma excision      on face-all done in office   . Quadriceps tendon repair Right 03/10/2014    Procedure: REPAIR QUADRICEP TENDON;  Surgeon: Carole Civil, MD;  Location: [facility];  Service: Orthopedics;  Laterality: Right;    There were no vitals filed for this visit.  Visit Diagnosis:  Knee stiffness, right  Hx of right knee surgery  Difficulty walking      Subjective Assessment - 07/30/14 1521    Subjective Patient notes  high amount of soreness after last session that has since resolved. limited pain dtoday primarily soreness.    Currently in Pain? No/denies                         [facility] Adult PT Treatment/Exercise - 07/30/14 0001    Knee/Hip Exercises: Stretches   Active Hamstring Stretch 30 seconds;3 reps   Active Hamstring Stretch Limitations 14 inch box    Quad Stretch 30 seconds;4 reps   Quad Stretch Limitations prone with rope    Knee: Self-Stretch Limitations Knee driver to 12" 61W 3 second hold 3 ways   Piriformis Stretch 3 reps;30 seconds   Piriformis Stretch Limitations supine manual   Gastroc Stretch 3 reps;30 seconds   Gastroc Stretch Limitations off step   Knee/Hip Exercises: Standing   Forward Lunges 10 reps;2 sets;Both   Forward Lunges Limitations 1 set static, 1 set dynamic   Side Lunges 10 reps   Lateral Step Up Step Height: 8";10 reps   Forward Step Up Step Height: 8";10 reps   Functional Squat Limitations sit to stand 10x 2 sets, minisquat with overhead press dumbbell matrix common 3x 5lb dumbbell.    Other Standing Knee Exercises Box walk around 10x each with therapist asssistance for  LE positioning.    Other Standing Knee Exercises Sumo walk green Tband 2RT                  PT Short Term Goals - 07/26/14 1628    PT SHORT TERM GOAL #1   Title patient will demonstrate increased Rt knee flexion to 60 degrees to be able to stand from high chairs without using UEs for support.    Baseline 05/27/2014 82 degrees flexion, able to STS from 22in chair without HHA   Time 2   Period Weeks   Status Achieved   PT SHORT TERM GOAL #2   Title Patient will be able to fully extend knee to normalize stride mechanics of gait.    Baseline 06/18/2014 0-98 degrees   Time 3   Period Weeks   Status Achieved   PT SHORT TERM GOAL #3   Title Patient will demonstrate independence with HEP.    Baseline Reports daily   Time 3   Period Weeks   Status Achieved   PT SHORT  TERM GOAL #4   Title Patient will dmeonstrate increased hip intenernal totion ROM to 25 degrees to improve decelration mechanics   Time 3   Period Weeks   Status Achieved           PT Long Term Goals - 07/26/14 1628    PT LONG TERM GOAL #1   Title patient will demonstrate increased Rt knee flexion to 90 degrees to be able to stand from typical chair without using UEs for support   Baseline 06/18/2014 AROM 0-98 degrees   Time 4   Period Weeks   Status Achieved   PT LONG TERM GOAL #2   Title patient will demonstrate increased Rt knee flexion to >100 degrees to be able to stand from typical chair without using UEs for support   Time 6   Period Weeks   Status On-going   PT LONG TERM GOAL #3   Title Patient will dmeonstrate 5/5 MMT for Rt knee flexion and extension to ambulate up and down stairs withtou assistance.    Time 6   Period Weeks   Status On-going   PT LONG TERM GOAL #4   Title Patient wll be able to ambulate without assistive device.    Time 6   Period Weeks   Status Achieved   PT LONG TERM GOAL #5   Title Patient will dmeonstrate increased hip intenernal rototion ROm to 35 degrees to improve deceleration mechanics   Time 6   Period Weeks   Status On-going               Plan - 07/30/14 1650    Clinical Impression Statement Patient's MD states no squatting greater than 45 degrees due to knee scar still healing. Session focused on maintenance of ROM and increasign RRt knee flexion htrough pain free stretching with a secondary focus on increasign LE strngth to normalize gait mechanincs and activity tolerance. continued overhead presses with mini squats. [id number] mintained this session secondry to patient noting high soreness.    PT Next Visit Plan Continues with stretches to improve flexibility and excursion walking with brace on to improve hip mobility with focus on increasing knee flexion. Continue focus on increasing knee flexion. no lunges or squatting beyond  45 degrees per MD. Progress sumo walk to ble Tband.         Problem List Patient Active Problem List   Diagnosis Date Noted  . Quadriceps muscle  rupture    Devona Konig PT DPT [facility] [place], [name], [address] Phone: [phone]   Fax:  [phone]

## 2014-08-02 ENCOUNTER — Ambulatory Visit (HOSPITAL_COMMUNITY): Payer: Medicare PPO | Admitting: Physical Therapy

## 2014-08-02 DIAGNOSIS — M25661 Stiffness of right knee, not elsewhere classified: Secondary | ICD-10-CM

## 2014-08-02 DIAGNOSIS — Z9889 Other specified postprocedural states: Secondary | ICD-10-CM

## 2014-08-02 DIAGNOSIS — R262 Difficulty in walking, not elsewhere classified: Secondary | ICD-10-CM

## 2014-08-02 NOTE — Therapy (Signed)
[place], [name], [address] Phone: [phone]   Fax:  [phone]  Physical Therapy Treatment  Patient Details  Name: Larry Graves MRN: [id number] Date of Birth: Nov 22, 1945 Referring Provider:  Delphina Cahill, MD  Encounter Date: 08/02/2014      PT End of Session - 08/02/14 1553    Visit Number 22   Number of Visits 30   Date for PT Re-Evaluation 08/23/14   Authorization Type [employer] Medicare   Authorization Time Period G-code complete 19th session   Authorization - Visit Number 22   Authorization - Number of Visits 29   PT Start Time 1512   PT Stop Time 1552   PT Time Calculation (min) 40 min   Activity Tolerance Patient tolerated treatment well  pt denies c/o pain after or during session. reports mild stiffness.    Behavior During Therapy [facility] for tasks assessed/performed      Past Medical History  Diagnosis Date  . Hypertension   . Diabetes mellitus without complication   . Heart murmur   . BPH (benign prostatic hyperplasia)   . History of alcoholism     Past Surgical History  Procedure Laterality Date  . Replacement total knee Left 2000?    [facility] in [place], [place]  . Carpal tunnel release Bilateral 1990's    [facility] in [place], [place]  . Nasal septum surgery  1980's    [facility] [place], [place]  . Foot surgery Left 2010?    bunionectomy-[facility] in [place], [place]  . Appendectomy  [facility] in [place], [place]  . Hernia repair  1953?    [facility] in [place], [place]  . Melanoma excision      on face-all done in office   . Quadriceps tendon repair Right 03/10/2014    Procedure: REPAIR QUADRICEP TENDON;  Surgeon: Carole Civil, MD;  Location: [facility];  Service: Orthopedics;  Laterality: Right;    There were no vitals filed for this visit.  Visit Diagnosis:  Knee stiffness, right  Hx of right knee surgery  Difficulty walking       Subjective Assessment - 08/02/14 1512    Subjective Pt reports everything has been going well, explaining good compliance with HEP, no c/o pain, and recent approval from physician to begin using the eliptical, which he has been using for 10 minutes at a time.    Pertinent History 03/10/14 Rt ratellar tendon repair following complete tear, Patient's hobbies ionclude: camoping, fishing, hunting, working in [employer]. Patient is retired.  no previous therapy                         [facility] Adult PT Treatment/Exercise - 08/02/14 0001    Knee/Hip Exercises: Stretches   Active Hamstring Stretch 30 seconds;3 reps   Active Hamstring Stretch Limitations 14 inch box    Quad Stretch 30 seconds;3 reps   Quad Stretch Limitations prone with rope    Piriformis Stretch 3 reps;30 seconds   Piriformis Stretch Limitations seated fig-4 strech c manual over-pressure; 3x30sec bilat    Gastroc Stretch 3 reps;30 seconds   Gastroc Stretch Limitations off step   Knee/Hip Exercises: Standing   Heel Raises 20 reps;1 set   Heel Raises Limitations on slant board   Forward Lunges Both;10 reps   Forward Lunges Limitations ROM limited to 45* knee flexion per  MD.    Side Lunges Both;10 reps   Lateral Step Up Step Height: 8";10 reps   Forward Step Up Step Height: 8";10 reps   Functional Squat 1 set;15 reps   Functional Squat Limitations Limited to 45 degrees Knee flexion   Other Standing Knee Exercises Box walk around 10x each with therapist asssistance for LE positioning.   R stance appears more limited in mobility than L    Other Standing Knee Exercises 3D hip excursions 1x10  Sagittal, Coronal, Transverse             Balance Exercises - 08/02/14 1547    Balance Exercises: Standing   Sidestepping 1 rep;Other (comment)  back against wall; 101ft R, 50ft L             PT Short Term Goals - 07/26/14 1628    PT SHORT TERM GOAL #1   Title patient will demonstrate increased Rt  knee flexion to 60 degrees to be able to stand from high chairs without using UEs for support.    Baseline 05/27/2014 82 degrees flexion, able to STS from 22in chair without HHA   Time 2   Period Weeks   Status Achieved   PT SHORT TERM GOAL #2   Title Patient will be able to fully extend knee to normalize stride mechanics of gait.    Baseline 06/18/2014 0-98 degrees   Time 3   Period Weeks   Status Achieved   PT SHORT TERM GOAL #3   Title Patient will demonstrate independence with HEP.    Baseline Reports daily   Time 3   Period Weeks   Status Achieved   PT SHORT TERM GOAL #4   Title Patient will dmeonstrate increased hip intenernal totion ROM to 25 degrees to improve decelration mechanics   Time 3   Period Weeks   Status Achieved           PT Long Term Goals - 07/26/14 1628    PT LONG TERM GOAL #1   Title patient will demonstrate increased Rt knee flexion to 90 degrees to be able to stand from typical chair without using UEs for support   Baseline 06/18/2014 AROM 0-98 degrees   Time 4   Period Weeks   Status Achieved   PT LONG TERM GOAL #2   Title patient will demonstrate increased Rt knee flexion to >100 degrees to be able to stand from typical chair without using UEs for support   Time 6   Period Weeks   Status On-going   PT LONG TERM GOAL #3   Title Patient will dmeonstrate 5/5 MMT for Rt knee flexion and extension to ambulate up and down stairs withtou assistance.    Time 6   Period Weeks   Status On-going   PT LONG TERM GOAL #4   Title Patient wll be able to ambulate without assistive device.    Time 6   Period Weeks   Status Achieved   PT LONG TERM GOAL #5   Title Patient will dmeonstrate increased hip intenernal rototion ROm to 35 degrees to improve deceleration mechanics   Time 6   Period Weeks   Status On-going               Plan - 08/02/14 1555    Clinical Impression Statement Patient continues to make progress toward goals as evidenced by  inclution of daily use of eliptical at home and decrease of pain throughout day. Pt improving in functional mobility,  and activity tolerance, but shoudl continue to focus on improving strength in BLE as well as  knee ROM. Pt to continue with POC to improve functional indep in IADL and ambulation.    Pt will benefit from skilled therapeutic intervention in order to improve on the following deficits Abnormal gait;Decreased endurance;Improper body mechanics;Impaired flexibility;Decreased strength;Difficulty walking;Pain;Decreased range of motion   Rehab Potential Good   PT Frequency 3x / week   PT Duration Other (comment)   PT Treatment/Interventions ADLs/Self Care Home Management;Gait training;Neuromuscular re-education;Stair training;Functional mobility training;Patient/family education;Passive range of motion;Therapeutic activities;Therapeutic exercise;Manual techniques;Balance training   PT Next Visit Plan Continues with stretches to improve flexibility and excursion walking with brace on to improve hip mobility with focus on increasing knee flexion. Continue focus on increasing knee flexion. no lunges or squatting beyond 45 degrees per MD. Inlclude sumo walk to blue wTband.    PT Home Exercise Plan Given; no updates at this time.    Consulted and Agree with Plan of Care Patient        Problem List Patient Active Problem List   Diagnosis Date Noted  . Quadriceps muscle rupture      Deniece Ree PT, DPT [place] [address] [place], [name], [address] Phone: [phone]   Fax:  [phone]

## 2014-08-04 ENCOUNTER — Ambulatory Visit (HOSPITAL_COMMUNITY): Payer: Medicare PPO | Admitting: Physical Therapy

## 2014-08-04 DIAGNOSIS — R262 Difficulty in walking, not elsewhere classified: Secondary | ICD-10-CM

## 2014-08-04 DIAGNOSIS — Z9889 Other specified postprocedural states: Secondary | ICD-10-CM

## 2014-08-04 DIAGNOSIS — M25661 Stiffness of right knee, not elsewhere classified: Secondary | ICD-10-CM | POA: Diagnosis not present

## 2014-08-04 NOTE — Therapy (Signed)
[place], [name], [address] Phone: [phone]   Fax:  [phone]  Physical Therapy Treatment  Patient Details  Name: Larry Graves MRN: [id number] Date of Birth: 1945-11-13 Referring Provider:  Delphina Cahill, MD  Encounter Date: 08/04/2014      PT End of Session - 08/04/14 1519    Visit Number 23   Number of Visits 30   Date for PT Re-Evaluation 08/23/14   Authorization Type [employer] Medicare   Authorization Time Period G-code complete 19th session   Authorization - Visit Number 23   Authorization - Number of Visits 29   PT Start Time 1518   PT Stop Time 1600   PT Time Calculation (min) 42 min   Activity Tolerance Patient tolerated treatment well   Behavior During Therapy [facility] for tasks assessed/performed      Past Medical History  Diagnosis Date  . Hypertension   . Diabetes mellitus without complication   . Heart murmur   . BPH (benign prostatic hyperplasia)   . History of alcoholism     Past Surgical History  Procedure Laterality Date  . Replacement total knee Left 2000?    [facility] in [place], [place]  . Carpal tunnel release Bilateral 1990's    [facility] in [place], [place]  . Nasal septum surgery  1980's    [facility] [place], [place]  . Foot surgery Left 2010?    bunionectomy-[facility] in [place], [place]  . Appendectomy  [facility] in [place], [place]  . Hernia repair  1953?    [facility] in [place], [place]  . Melanoma excision      on face-all done in office   . Quadriceps tendon repair Right 03/10/2014    Procedure: REPAIR QUADRICEP TENDON;  Surgeon: Carole Civil, MD;  Location: [facility];  Service: Orthopedics;  Laterality: Right;    There were no vitals filed for this visit.  Visit Diagnosis:  Knee stiffness, right  Hx of right knee surgery  Difficulty walking      Subjective Assessment - 08/04/14 1520    Subjective No pain, patient notes  wound is healing and expects anterior knee wound to be healed this week.             [facility] Adult PT Treatment/Exercise - 08/04/14 0001    Knee/Hip Exercises: Stretches   Active Hamstring Stretch 30 seconds;3 reps   Active Hamstring Stretch Limitations 14 inch box    Quad Stretch 30 seconds;3 reps   Quad Stretch Limitations prone with rope    Hip Flexor Stretch 4 reps;20 seconds   Hip Flexor Stretch Limitations to 14" box    Piriformis Stretch 3 reps;30 seconds   Piriformis Stretch Limitations seated fig-4 strech c manual over-pressure; 3x30sec bilat    Gastroc Stretch 3 reps;30 seconds   Gastroc Stretch Limitations off step   Knee/Hip Exercises: Standing   Heel Raises 20 reps;1 set   Heel Raises Limitations on slant board   Forward Lunges Limitations lunge matrix common with 5lb dumbells 10x each   Forward Step Up Limitations 12" box step ups 10x each    Functional Squat Limitations Squat matrix x each    Walking with Sports Cord Sumo and reverse monster walk with blue and green Tband 25ft Round trip each   Other Standing Knee Exercises Box walk around 10x each with therapist asssistance for LE positioning.  R stance appears more limited in mobility than L    Other Standing Knee Exercises Dead lifts: 30lb and 40lb 10x each, squats 30 and 40lb 10x each             PT Short Term Goals - 07/26/14 1628    PT SHORT TERM GOAL #1   Title patient will demonstrate increased Rt knee flexion to 60 degrees to be able to stand from high chairs without using UEs for support.    Baseline 05/27/2014 82 degrees flexion, able to STS from 22in chair without HHA   Time 2   Period Weeks   Status Achieved   PT SHORT TERM GOAL #2   Title Patient will be able to fully extend knee to normalize stride mechanics of gait.    Baseline 06/18/2014 0-98 degrees   Time 3   Period Weeks   Status Achieved   PT SHORT TERM GOAL #3   Title Patient will demonstrate independence with HEP.    Baseline  Reports daily   Time 3   Period Weeks   Status Achieved   PT SHORT TERM GOAL #4   Title Patient will dmeonstrate increased hip intenernal totion ROM to 25 degrees to improve decelration mechanics   Time 3   Period Weeks   Status Achieved           PT Long Term Goals - 07/26/14 1628    PT LONG TERM GOAL #1   Title patient will demonstrate increased Rt knee flexion to 90 degrees to be able to stand from typical chair without using UEs for support   Baseline 06/18/2014 AROM 0-98 degrees   Time 4   Period Weeks   Status Achieved   PT LONG TERM GOAL #2   Title patient will demonstrate increased Rt knee flexion to >100 degrees to be able to stand from typical chair without using UEs for support   Time 6   Period Weeks   Status On-going   PT LONG TERM GOAL #3   Title Patient will dmeonstrate 5/5 MMT for Rt knee flexion and extension to ambulate up and down stairs withtou assistance.    Time 6   Period Weeks   Status On-going   PT LONG TERM GOAL #4   Title Patient wll be able to ambulate without assistive device.    Time 6   Period Weeks   Status Achieved   PT LONG TERM GOAL #5   Title Patient will dmeonstrate increased hip intenernal rototion ROm to 35 degrees to improve deceleration mechanics   Time 6   Period Weeks   Status On-going               Plan - 08/04/14 1606    Clinical Impression Statement Session continued focus on functional strengthenign of bilateral LE to increase strength so patient can return to all regular outdoor activities. Patient instructed By PT to try all regular activities at home to determine if patient has nay other specific physical therapy needs.    PT Next Visit Plan Continues with stretches to improve flexibility to improve hip mobility with focus on increasing knee flexion. Continue focus on increasing knee flexion. no lunges or squatting beyond 45 degrees per MD. Inlclude sumo walk to blue wTband. Introduce agility ladder for balance  and coordination.         Problem List Patient Active Problem List   Diagnosis Date Noted  . Quadriceps muscle rupture    Emelin Dascenzo PT  DPT [place], [name], [address] Phone: [phone]   Fax:  [phone]

## 2014-08-06 ENCOUNTER — Ambulatory Visit (HOSPITAL_COMMUNITY): Payer: Medicare PPO | Admitting: Physical Therapy

## 2014-08-06 DIAGNOSIS — R262 Difficulty in walking, not elsewhere classified: Secondary | ICD-10-CM

## 2014-08-06 DIAGNOSIS — M25661 Stiffness of right knee, not elsewhere classified: Secondary | ICD-10-CM

## 2014-08-06 DIAGNOSIS — Z9889 Other specified postprocedural states: Secondary | ICD-10-CM

## 2014-08-06 NOTE — Therapy (Addendum)
[facility] [address] [place], [name], [address] Phone: [phone]   Fax:  [phone]  Physical Therapy Treatment  Patient Details  Name: Larry Graves MRN: [id number] Date of Birth: 08/01/1945 Referring Provider:  Carole Civil, MD  Encounter Date: 08/06/2014      PT End of Session - 08/06/14 1555    Visit Number 24   Number of Visits 30   Date for PT Re-Evaluation 08/23/14   Authorization Type [employer] Medicare   Authorization Time Period G-code complete 19th session   Authorization - Visit Number 24   Authorization - Number of Visits 29   PT Start Time 1517   PT Stop Time 1600   PT Time Calculation (min) 43 min   Activity Tolerance Patient tolerated treatment well   Behavior During Therapy [facility] for tasks assessed/performed      Past Medical History  Diagnosis Date  . Hypertension   . Diabetes mellitus without complication   . Heart murmur   . BPH (benign prostatic hyperplasia)   . History of alcoholism     Past Surgical History  Procedure Laterality Date  . Replacement total knee Left 2000?    [facility] in [place], [place]  . Carpal tunnel release Bilateral 1990's    [facility] in [place], [place]  . Nasal septum surgery  1980's    [facility] [place], [place]  . Foot surgery Left 2010?    [facility]unionectomy-[facility] in [place], [place]  . Appendectomy  [facility] in [place], [place]  . Hernia repair  1953?    [facility] in [place], [place]  . Melanoma excision      on face-all done in office   . Quadriceps tendon repair Right 03/10/2014    Procedure: REPAIR QUADRICEP TENDON;  Surgeon: Carole Civil, MD;  Location: [facility];  Service: Orthopedics;  Laterality: Right;    There were no vitals filed for this visit.  Visit Diagnosis:  Knee stiffness, right  Hx of right knee surgery  Difficulty walking      Subjective Assessment - 08/06/14 1523    Subjective No pain notes  some stiffness with different walking drills    Currently in Pain? No/denies           [facility] Adult PT Treatment/Exercise - 08/06/14 0001    Knee/Hip Exercises: Stretches   Active Hamstring Stretch Limitations 10x 3 seconds to 8" 3 way   Quad Stretch 30 seconds;3 reps   Quad Stretch Limitations prone with rope    Hip Flexor Stretch Limitations 10x 3 seconds 3 way to 12"    Piriformis Stretch 3 reps;30 seconds   Piriformis Stretch Limitations seated fig-4 strech c manual over-pressure; 3x30sec bilat    Gastroc Stretch 3 reps;30 seconds   Gastroc Stretch Limitations off step   Knee/Hip Exercises: Standing   Forward Lunges Limitations lunge matrix common and uncommon through ROM limits 5x each    Walking with Sports Cord 4 way sport cord walk with 2 corcds 2 RT each   Other Standing Knee Exercises Box walk around 10x each with therapist asssistance for LE positioning.   R stance appears more limited in mobility than L    Other Standing Knee Exercises Dead lifts:  40lb 2 sets 10reps each, squats 40lb 2 sets 10x each             PT Short Term Goals - 07/26/14 0347  PT SHORT TERM GOAL #1   Title patient will demonstrate increased Rt knee flexion to 60 degrees to be able to stand from high chairs without using UEs for support.    Baseline 05/27/2014 82 degrees flexion, able to STS from 22in chair without HHA   Time 2   Period Weeks   Status Achieved   PT SHORT TERM GOAL #2   Title Patient will be able to fully extend knee to normalize stride mechanics of gait.    Baseline 06/18/2014 0-98 degrees   Time 3   Period Weeks   Status Achieved   PT SHORT TERM GOAL #3   Title Patient will demonstrate independence with HEP.    Baseline Reports daily   Time 3   Period Weeks   Status Achieved   PT SHORT TERM GOAL #4   Title Patient will dmeonstrate increased hip intenernal totion ROM to 25 degrees to improve decelration mechanics   Time 3   Period Weeks   Status Achieved            PT Long Term Goals - 07/26/14 1628    PT LONG TERM GOAL #1   Title patient will demonstrate increased Rt knee flexion to 90 degrees to be able to stand from typical chair without using UEs for support   Baseline 06/18/2014 AROM 0-98 degrees   Time 4   Period Weeks   Status Achieved   PT LONG TERM GOAL #2   Title patient will demonstrate increased Rt knee flexion to >100 degrees to be able to stand from typical chair without using UEs for support   Time 6   Period Weeks   Status On-going   PT LONG TERM GOAL #3   Title Patient will dmeonstrate 5/5 MMT for Rt knee flexion and extension to ambulate up and down stairs withtou assistance.    Time 6   Period Weeks   Status On-going   PT LONG TERM GOAL #4   Title Patient wll be able to ambulate without assistive device.    Time 6   Period Weeks   Status Achieved   PT LONG TERM GOAL #5   Title Patient will dmeonstrate increased hip intenernal rototion ROm to 35 degrees to improve deceleration mechanics   Time 6   Period Weeks   Status On-going               Plan - 08/06/14 1557    Clinical Impression Statement Session progressed dynamism of exercises for increased strengthening and stability. Patient displays slow but good learning with no pain throughout. Patient performed lunge matrix common and uncommon though uncommon directions were limited in depth and ROM due to continued stiffness.    PT Next Visit Plan Continues with stretches to improve flexibility to improve hip mobility with focus on increasing knee flexion. Continue focus on increasing knee flexion. no lunges or squatting beyond 45 degrees per MD. Progress sumo walk to blue wTband.        [id number]  Problem List Patient Active Problem List   Diagnosis Date Noted  . Quadriceps muscle rupture    Devona Konig PT DPT [place], [name], [address] Phone: [phone]    Fax:  [phone]   PHYSICAL THERAPY DISCHARGE SUMMARY  Visits from Start of Care: 24  Current functional level related to goals / functional outcomes: Improved strength and Rom   Remaining deficits: Knee ROM  Education / Equipment: HEP  Plan: Patient agrees to discharge.  Patient goals were partially met. Patient is being discharged due to not returning since the last visit.  ?????       Rayetta Humphrey, [place] CLT [phone]

## 2014-08-11 ENCOUNTER — Ambulatory Visit (INDEPENDENT_AMBULATORY_CARE_PROVIDER_SITE_OTHER): Payer: Medicare PPO | Admitting: Orthopedic Surgery

## 2014-08-11 ENCOUNTER — Telehealth (HOSPITAL_COMMUNITY): Payer: Self-pay | Admitting: Physical Therapy

## 2014-08-11 VITALS — BP 156/95 | Ht 63.0 in | Wt 182.0 lb

## 2014-08-11 DIAGNOSIS — T8131XD Disruption of external operation (surgical) wound, not elsewhere classified, subsequent encounter: Secondary | ICD-10-CM | POA: Diagnosis not present

## 2014-08-11 DIAGNOSIS — S76111S Strain of right quadriceps muscle, fascia and tendon, sequela: Secondary | ICD-10-CM | POA: Diagnosis not present

## 2014-08-11 NOTE — Progress Notes (Signed)
Patient ID: Larry Graves, male   DOB: 18-Apr-1945, 69 y.o.   MRN: [id number] Chief Complaint  Patient presents with  . Follow-up    2 week follow up wound check right knee    Surgery 03/10/2014 quadriceps tendon repair right knee, located by wound breakdown. Wound is now 4 x 2 mm superficial. Recommend continue dressing changes follow-up in September because he is going to [place]. He's been compliant and diligent with dressing changes and wound care and he will call me and come back only if he is having any problems

## 2014-08-11 NOTE — Telephone Encounter (Signed)
He has to go to [place] to take care of a family issue and will call us when they get back

## 2014-08-16 ENCOUNTER — Encounter (HOSPITAL_COMMUNITY): Payer: Medicare PPO | Admitting: Physical Therapy

## 2014-08-18 ENCOUNTER — Encounter (HOSPITAL_COMMUNITY): Payer: Medicare PPO | Admitting: Physical Therapy

## 2014-08-31 ENCOUNTER — Encounter (HOSPITAL_COMMUNITY): Payer: Medicare PPO | Admitting: Physical Therapy

## 2014-09-02 ENCOUNTER — Encounter (HOSPITAL_COMMUNITY): Payer: Medicare PPO

## 2014-09-07 ENCOUNTER — Encounter (HOSPITAL_COMMUNITY): Payer: Medicare PPO | Admitting: Physical Therapy

## 2014-09-09 ENCOUNTER — Encounter (HOSPITAL_COMMUNITY): Payer: Medicare PPO | Admitting: Physical Therapy

## 2014-12-02 ENCOUNTER — Ambulatory Visit (INDEPENDENT_AMBULATORY_CARE_PROVIDER_SITE_OTHER): Payer: Medicare PPO | Admitting: Orthopedic Surgery

## 2014-12-02 VITALS — BP 145/85 | Ht 63.0 in | Wt 180.0 lb

## 2014-12-02 DIAGNOSIS — S76111S Strain of right quadriceps muscle, fascia and tendon, sequela: Secondary | ICD-10-CM

## 2014-12-02 NOTE — Progress Notes (Signed)
Patient ID: Larry Graves, male   DOB: 1945-11-29, 70 y.o.   MRN: [id number]  Follow up visit  Chief Complaint  Patient presents with  . Follow-up    3 month follow up right quad tendon, DOS 03/10/14    BP 145/85 mmHg  Ht 5\' 3"  (1.6 m)  Wt 180 lb (81.647 kg)  BMI 31.89 kg/m2  No diagnosis found.   Follow-up after quadriceps tendon repair [place] by wound breakdown  Patient went to  [place] for the summer did well no problems  He does wear knee brace for strenuous activity   Review of systems no weakness in the knee. He's been able to return to normal activities  Examination reveals normal heel-toe gait skin incision is healed with no drainage or redness. Knee has no effusion. He's regained approximately 115 of knee flexion with full extension knee is stable motor exam is normal to manual muscle testing and has good distal pulse and normal sensation  Wear knee brace as needed economy hinged brace was given.  Follow-up if any problems

## 2015-03-21 ENCOUNTER — Telehealth: Payer: Self-pay | Admitting: Orthopedic Surgery

## 2015-03-21 NOTE — Telephone Encounter (Signed)
Patient aware.

## 2015-03-21 NOTE — Telephone Encounter (Signed)
Last seen 12/02/14 - earlier Quad Tendon surgery - wants to begin Gym workout routine and wants to be sure OK or should he be evaluated first?   Routing to Dr Aline Brochure to advise

## 2015-03-21 NOTE — Telephone Encounter (Signed)
Gym routine is fine avoid deep squats

## 2015-06-03 ENCOUNTER — Ambulatory Visit (INDEPENDENT_AMBULATORY_CARE_PROVIDER_SITE_OTHER): Payer: Medicare PPO | Admitting: Urology

## 2015-06-03 DIAGNOSIS — R361 Hematospermia: Secondary | ICD-10-CM | POA: Diagnosis not present

## 2015-06-03 DIAGNOSIS — N4340 Spermatocele of epididymis, unspecified: Secondary | ICD-10-CM | POA: Diagnosis not present

## 2015-07-29 ENCOUNTER — Ambulatory Visit (INDEPENDENT_AMBULATORY_CARE_PROVIDER_SITE_OTHER): Payer: Medicare PPO | Admitting: Urology

## 2015-07-29 DIAGNOSIS — R361 Hematospermia: Secondary | ICD-10-CM

## 2015-11-25 ENCOUNTER — Ambulatory Visit (INDEPENDENT_AMBULATORY_CARE_PROVIDER_SITE_OTHER): Payer: Medicare PPO | Admitting: Urology

## 2015-11-25 DIAGNOSIS — R361 Hematospermia: Secondary | ICD-10-CM

## 2015-11-25 DIAGNOSIS — N401 Enlarged prostate with lower urinary tract symptoms: Secondary | ICD-10-CM

## 2016-03-09 ENCOUNTER — Ambulatory Visit (INDEPENDENT_AMBULATORY_CARE_PROVIDER_SITE_OTHER): Payer: Medicare PPO | Admitting: Urology

## 2016-03-09 DIAGNOSIS — N401 Enlarged prostate with lower urinary tract symptoms: Secondary | ICD-10-CM

## 2016-03-09 DIAGNOSIS — R361 Hematospermia: Secondary | ICD-10-CM | POA: Diagnosis not present

## 2016-03-13 NOTE — Progress Notes (Signed)
Cardiology Office Note   Date:  03/14/2016   ID:  CROSBY GARA, DOB [birth year], MRN [id number]  PCP:  Wende Neighbors, MD  Cardiologist:   Dorris Carnes, MD    Patient referrd for eval of CP     History of Present Illness: Larry Graves is a 71 y.o. male with a history of HTN, HL, CP  And DM Pt says he has neck problems  When his neck hurts he will get pain on the L side of his chest  Not all the time   [facility] an hour several times per week  Ramps up  No SOB  No CP with physcial activity        Current Meds  Medication Sig  . finasteride (PROSCAR) 5 MG tablet Take 5 mg by mouth daily.   Marland Kitchen lisinopril-hydrochlorothiazide (PRINZIDE,ZESTORETIC) 20-12.5 MG tablet Take 1 tablet by mouth daily.   . Multiple Vitamin (MULTIVITAMIN WITH MINERALS) TABS tablet Take 1 tablet by mouth daily.  . naproxen (NAPROSYN) 500 MG tablet Take 500 mg by mouth 2 (two) times daily with a meal.  . tamsulosin (FLOMAX) 0.4 MG CAPS capsule Take 0.4 mg by mouth.  . vitamin B-12 (CYANOCOBALAMIN) 1000 MCG tablet Take 1,000 mcg by mouth daily.  . vitamin C (ASCORBIC ACID) 500 MG tablet Take 500 mg by mouth daily.     Allergies:   Bacitracin; Desonide; Dicloxacillin; and Triamcinolone   Past Medical History:  Diagnosis Date  . BPH (benign prostatic hyperplasia)   . Diabetes mellitus without complication ([place])   . Heart murmur   . History of alcoholism ([place])   . Hypertension     Past Surgical History:  Procedure Laterality Date  . [facility] in [place], [place] Bilateral [phone]   [facility] in [place], [place] Left 2010?   [facility] in [place], [place]?   [facility] in [place], [place]     on face-all done in office   . NASAL SEPTUM SURGERY  1980's   [facility] [place], [place] Right 03/10/2014   Procedure: REPAIR QUADRICEP TENDON;  Surgeon:  Carole Civil, MD;  Location: [facility];  Service: Orthopedics;  Laterality: Right;  . REPLACEMENT TOTAL KNEE Left 2000?   [facility] in [place], [place]     Social History:  The patient  reports that he has quit smoking. His smoking use included Cigarettes. He quit after 5.00 years of use. He has quit using smokeless tobacco. He reports that he does not drink alcohol or use drugs.   Family History:  The patient's family history includes Cancer in his brother; Diabetes in his mother; Heart murmur in his mother.    ROS:  Please see the history of present illness. All other systems are reviewed and  Negative to the above problem except as noted.    PHYSICAL EXAM: VS:  BP (!) 170/80   Pulse 84   Ht 5\' 3"  (1.6 m)   Wt 190 lb (86.2 kg)   SpO2 97%   BMI 33.66 kg/m   GEN: Obese , well developed, in no acute distress  HEENT: normal  Neck: no JVD, carotid bruits, or masses Cardiac: RRR; Gr I-II/VI sytolic murmur apex and base  Norubs, or gallops,no edema  Respiratory:  clear to auscultation bilaterally, normal work  of breathing GI: soft, nontender, nondistended, + BS  No hepatomegaly  MS: no deformity Moving all extremities   Skin: warm and dry, no rash Neuro:  Strength and sensation are intact Psych: euthymic mood, full affect   EKG:  EKG is ordered today.  SR 78  T wave inversion III, AVF   Lipid Panel No results found for: CHOL, TRIG, HDL, CHOLHDL, VLDL, LDLCALC, LDLDIRECT    Wt Readings from Last 3 Encounters:  03/14/16 190 lb (86.2 kg)  12/02/14 180 lb (81.6 kg)  08/11/14 182 lb (82.6 kg)      ASSESSMENT AND PLAN:  1  CP  Pain is atypical  I am not convinced represents ischemia  EKG is not normal  Old  I would recomm echo to evlauate    2  HTN BP is not well controlled  He is anxious  I would follow  Will net change meds for now  3  HL  Discussed diet  F/U will be based on test results     Current medicines are reviewed at length with the patient today.  The  patient does not have concerns regarding medicines.  Signed, Dorris Carnes, MD  03/14/2016 9:25 AM    [facility] [place], [place], [place]  [address] Phone: [phone]; Fax: [phone]

## 2016-03-14 ENCOUNTER — Encounter: Payer: Self-pay | Admitting: Internal Medicine

## 2016-03-14 ENCOUNTER — Ambulatory Visit (INDEPENDENT_AMBULATORY_CARE_PROVIDER_SITE_OTHER): Payer: Medicare HMO | Admitting: Internal Medicine

## 2016-03-14 VITALS — BP 170/80 | HR 84 | Ht 63.0 in | Wt 190.0 lb

## 2016-03-14 DIAGNOSIS — R011 Cardiac murmur, unspecified: Secondary | ICD-10-CM

## 2016-03-14 DIAGNOSIS — R079 Chest pain, unspecified: Secondary | ICD-10-CM

## 2016-03-14 NOTE — Patient Instructions (Signed)
Your physician recommends that you schedule a follow-up appointment in:  To be determined after test    Your physician has requested that you have an echocardiogram. Echocardiography is a painless test that uses sound waves to create images of your heart. It provides your doctor with information about the size and shape of your heart and how well your heart's chambers and valves are working. This procedure takes approximately one hour. There are no restrictions for this procedure.     Your physician recommends that you continue on your current medications as directed. Please refer to the Current Medication list given to you today.     Thank you for choosing [facility] !

## 2016-03-16 ENCOUNTER — Ambulatory Visit (HOSPITAL_COMMUNITY)
Admission: RE | Admit: 2016-03-16 | Discharge: 2016-03-16 | Disposition: A | Discharge status: Home / Self Care | Payer: Medicare HMO | Source: Ambulatory Visit | Attending: Internal Medicine | Admitting: Internal Medicine

## 2016-03-16 DIAGNOSIS — Z6831 Body mass index (BMI) 31.0-31.9, adult: Secondary | ICD-10-CM | POA: Diagnosis not present

## 2016-03-16 DIAGNOSIS — R011 Cardiac murmur, unspecified: Secondary | ICD-10-CM | POA: Insufficient documentation

## 2016-03-16 DIAGNOSIS — I10 Essential (primary) hypertension: Secondary | ICD-10-CM | POA: Diagnosis not present

## 2016-03-16 DIAGNOSIS — Z87891 Personal history of nicotine dependence: Secondary | ICD-10-CM | POA: Insufficient documentation

## 2016-03-16 DIAGNOSIS — I071 Rheumatic tricuspid insufficiency: Secondary | ICD-10-CM | POA: Diagnosis not present

## 2016-03-16 DIAGNOSIS — E785 Hyperlipidemia, unspecified: Secondary | ICD-10-CM | POA: Insufficient documentation

## 2016-03-16 NOTE — Progress Notes (Signed)
*  PRELIMINARY RESULTS* Echocardiogram 2D Echocardiogram has been performed.  Leavy Cella 03/16/2016, 12:18 PM

## 2016-06-04 DIAGNOSIS — R05 Cough: Secondary | ICD-10-CM | POA: Diagnosis not present

## 2016-06-04 DIAGNOSIS — J060 Acute laryngopharyngitis: Secondary | ICD-10-CM | POA: Diagnosis not present

## 2016-06-25 DIAGNOSIS — L570 Actinic keratosis: Secondary | ICD-10-CM | POA: Diagnosis not present

## 2016-06-25 DIAGNOSIS — Z85828 Personal history of other malignant neoplasm of skin: Secondary | ICD-10-CM | POA: Diagnosis not present

## 2016-06-29 DIAGNOSIS — H524 Presbyopia: Secondary | ICD-10-CM | POA: Diagnosis not present

## 2016-06-29 DIAGNOSIS — E119 Type 2 diabetes mellitus without complications: Secondary | ICD-10-CM | POA: Diagnosis not present

## 2016-06-29 DIAGNOSIS — H02052 Trichiasis without entropian right lower eyelid: Secondary | ICD-10-CM | POA: Diagnosis not present

## 2016-06-29 DIAGNOSIS — H353111 Nonexudative age-related macular degeneration, right eye, early dry stage: Secondary | ICD-10-CM | POA: Diagnosis not present

## 2016-07-19 DIAGNOSIS — E782 Mixed hyperlipidemia: Secondary | ICD-10-CM | POA: Diagnosis not present

## 2016-07-19 DIAGNOSIS — R7301 Impaired fasting glucose: Secondary | ICD-10-CM | POA: Diagnosis not present

## 2016-07-19 DIAGNOSIS — I10 Essential (primary) hypertension: Secondary | ICD-10-CM | POA: Diagnosis not present

## 2016-07-19 DIAGNOSIS — N401 Enlarged prostate with lower urinary tract symptoms: Secondary | ICD-10-CM | POA: Diagnosis not present

## 2016-07-23 DIAGNOSIS — Z6831 Body mass index (BMI) 31.0-31.9, adult: Secondary | ICD-10-CM | POA: Diagnosis not present

## 2016-07-23 DIAGNOSIS — I10 Essential (primary) hypertension: Secondary | ICD-10-CM | POA: Diagnosis not present

## 2016-07-23 DIAGNOSIS — R7301 Impaired fasting glucose: Secondary | ICD-10-CM | POA: Diagnosis not present

## 2016-07-23 DIAGNOSIS — N400 Enlarged prostate without lower urinary tract symptoms: Secondary | ICD-10-CM | POA: Diagnosis not present

## 2016-07-23 DIAGNOSIS — M1991 Primary osteoarthritis, unspecified site: Secondary | ICD-10-CM | POA: Diagnosis not present

## 2016-07-23 DIAGNOSIS — E782 Mixed hyperlipidemia: Secondary | ICD-10-CM | POA: Diagnosis not present

## 2016-08-07 DIAGNOSIS — I10 Essential (primary) hypertension: Secondary | ICD-10-CM | POA: Diagnosis not present

## 2016-08-08 ENCOUNTER — Ambulatory Visit (INDEPENDENT_AMBULATORY_CARE_PROVIDER_SITE_OTHER): Payer: Medicare HMO | Admitting: Urology

## 2016-08-08 DIAGNOSIS — R361 Hematospermia: Secondary | ICD-10-CM | POA: Diagnosis not present

## 2016-08-08 DIAGNOSIS — N401 Enlarged prostate with lower urinary tract symptoms: Secondary | ICD-10-CM

## 2016-08-15 DIAGNOSIS — E875 Hyperkalemia: Secondary | ICD-10-CM | POA: Diagnosis not present

## 2016-08-15 DIAGNOSIS — Z6830 Body mass index (BMI) 30.0-30.9, adult: Secondary | ICD-10-CM | POA: Diagnosis not present

## 2016-08-15 DIAGNOSIS — I10 Essential (primary) hypertension: Secondary | ICD-10-CM | POA: Diagnosis not present

## 2016-12-17 DIAGNOSIS — Z23 Encounter for immunization: Secondary | ICD-10-CM | POA: Diagnosis not present

## 2017-01-23 DIAGNOSIS — R7301 Impaired fasting glucose: Secondary | ICD-10-CM | POA: Diagnosis not present

## 2017-01-23 DIAGNOSIS — E782 Mixed hyperlipidemia: Secondary | ICD-10-CM | POA: Diagnosis not present

## 2017-01-23 DIAGNOSIS — I10 Essential (primary) hypertension: Secondary | ICD-10-CM | POA: Diagnosis not present

## 2017-01-25 DIAGNOSIS — N400 Enlarged prostate without lower urinary tract symptoms: Secondary | ICD-10-CM | POA: Diagnosis not present

## 2017-01-25 DIAGNOSIS — E782 Mixed hyperlipidemia: Secondary | ICD-10-CM | POA: Diagnosis not present

## 2017-01-25 DIAGNOSIS — I10 Essential (primary) hypertension: Secondary | ICD-10-CM | POA: Diagnosis not present

## 2017-01-25 DIAGNOSIS — Z0000 Encounter for general adult medical examination without abnormal findings: Secondary | ICD-10-CM | POA: Diagnosis not present

## 2017-01-25 DIAGNOSIS — E875 Hyperkalemia: Secondary | ICD-10-CM | POA: Diagnosis not present

## 2017-01-25 DIAGNOSIS — M79671 Pain in right foot: Secondary | ICD-10-CM | POA: Diagnosis not present

## 2017-01-25 DIAGNOSIS — M79672 Pain in left foot: Secondary | ICD-10-CM | POA: Diagnosis not present

## 2017-01-25 DIAGNOSIS — R7301 Impaired fasting glucose: Secondary | ICD-10-CM | POA: Diagnosis not present

## 2017-01-25 DIAGNOSIS — M25511 Pain in right shoulder: Secondary | ICD-10-CM | POA: Diagnosis not present

## 2017-01-25 DIAGNOSIS — M1991 Primary osteoarthritis, unspecified site: Secondary | ICD-10-CM | POA: Diagnosis not present

## 2017-02-15 ENCOUNTER — Ambulatory Visit (INDEPENDENT_AMBULATORY_CARE_PROVIDER_SITE_OTHER): Payer: Medicare HMO | Admitting: Podiatry

## 2017-02-15 ENCOUNTER — Encounter: Payer: Self-pay | Admitting: Podiatry

## 2017-02-15 ENCOUNTER — Ambulatory Visit (INDEPENDENT_AMBULATORY_CARE_PROVIDER_SITE_OTHER): Payer: Medicare HMO

## 2017-02-15 ENCOUNTER — Ambulatory Visit: Payer: Self-pay | Admitting: Podiatry

## 2017-02-15 DIAGNOSIS — M722 Plantar fascial fibromatosis: Secondary | ICD-10-CM

## 2017-02-18 NOTE — Progress Notes (Signed)
   Subjective: 71 year old male presenting today as a new patient with a complaint of pain and tenderness in the bilateral arches, left worse than right that has been ongoing for several years.  He also reports associated numbness in the bilateral great toes. He wants to discuss getting orthotics.  Walking for long periods of time increases the pain.  Resting the feet helps alleviate the pain.  He has tried orthotics and steroid injections in the past.  Patient presents today for further treatment and evaluation.  Past Medical History:  Diagnosis Date  . BPH (benign prostatic hyperplasia)   . Diabetes mellitus without complication ([place])   . Heart murmur   . History of alcoholism ([place])   . Hypertension      Objective: Physical Exam General: The patient is alert and oriented x3 in no acute distress.  Dermatology: Skin is warm, dry and supple bilateral lower extremities. Negative for open lesions or macerations bilateral.   Vascular: Dorsalis Pedis and Posterior Tibial pulses palpable bilateral.  Capillary fill time is immediate to all digits.  Neurological: Epicritic and protective threshold intact bilateral.   Musculoskeletal: Tenderness to palpation at the medial calcaneal tubercale and through the insertion of the plantar fascia of the left foot. All other joints range of motion within normal limits bilateral. Strength 5/5 in all groups bilateral.   Radiographic exam:   Normal osseous mineralization. Joint spaces preserved. No fracture/dislocation/boney destruction. Calcaneal spur present with mild thickening of plantar fascia left. No other soft tissue abnormalities or radiopaque foreign bodies.   Assessment: 1. Plantar fasciitis left foot - midsubstance 2.  Generalized foot fatigue  Plan of Care:  1. Patient evaluated. Xrays reviewed.   2.  Appointment with Liliane Channel for custom molded orthotics. 3.  Continue taking OTC naproxen daily. 4.  Return to clinic as needed.   Edrick Kins, DPM [facility]  Dr. Edrick Kins, DPM    [address], [place] [address]                Office [phone]  Fax [phone]

## 2017-02-19 ENCOUNTER — Ambulatory Visit (INDEPENDENT_AMBULATORY_CARE_PROVIDER_SITE_OTHER): Payer: Medicare HMO

## 2017-02-19 ENCOUNTER — Ambulatory Visit (INDEPENDENT_AMBULATORY_CARE_PROVIDER_SITE_OTHER): Payer: Medicare HMO | Admitting: Orthopedic Surgery

## 2017-02-19 ENCOUNTER — Encounter: Payer: Self-pay | Admitting: Orthopedic Surgery

## 2017-02-19 VITALS — BP 183/79 | HR 75 | Ht 63.0 in | Wt 181.0 lb

## 2017-02-19 DIAGNOSIS — M542 Cervicalgia: Secondary | ICD-10-CM

## 2017-02-19 DIAGNOSIS — M25511 Pain in right shoulder: Secondary | ICD-10-CM

## 2017-02-19 DIAGNOSIS — M19011 Primary osteoarthritis, right shoulder: Secondary | ICD-10-CM

## 2017-02-19 DIAGNOSIS — M47812 Spondylosis without myelopathy or radiculopathy, cervical region: Secondary | ICD-10-CM | POA: Diagnosis not present

## 2017-02-19 NOTE — Progress Notes (Signed)
Progress Note   Patient ID: Larry Graves, male   DOB: 25-Jun-1945, 71 y.o.   MRN: [id number]  Chief Complaint  Patient presents with  . Shoulder Pain    right shoulder pain worse with ROM  . Neck Pain    chronic neck pain has had MRI / xrays 20 yrs ago    HPI   ROS Current Meds  Medication Sig  . cholecalciferol (VITAMIN D) 1000 units tablet Take 1,000 Units by mouth daily.  . finasteride (PROSCAR) 5 MG tablet Take 5 mg by mouth daily.   Marland Kitchen losartan-hydrochlorothiazide (HYZAAR) 100-25 MG tablet   . Multiple Vitamin (MULTIVITAMIN WITH MINERALS) TABS tablet Take 1 tablet by mouth daily.  . Multiple Vitamins-Minerals (PRESERVISION AREDS 2 PO) Take by mouth.  . naproxen (NAPROSYN) 500 MG tablet Take 500 mg by mouth 2 (two) times daily with a meal.  . tamsulosin (FLOMAX) 0.4 MG CAPS capsule Take 0.4 mg by mouth.  . traMADol (ULTRAM) 50 MG tablet Take by mouth every 6 (six) hours as needed.  . vitamin B-12 (CYANOCOBALAMIN) 1000 MCG tablet Take 1,000 mcg by mouth daily.  . vitamin C (ASCORBIC ACID) 500 MG tablet Take 500 mg by mouth daily.    Past Medical History:  Diagnosis Date  . BPH (benign prostatic hyperplasia)   . Diabetes mellitus without complication ([place])   . Heart murmur   . History of alcoholism ([place])   . Hypertension      Allergies  Allergen Reactions  . Bacitracin Rash  . Desonide Rash  . Dicloxacillin Rash  . Triamcinolone Rash    BP (!) 183/79   Pulse 75   Ht 5\' 3"  (1.6 m)   Wt 181 lb (82.1 kg)   BMI 32.06 kg/m    Physical Exam  Constitutional: He is oriented to person, place, and time. He appears well-developed and well-nourished. No distress.  HENT:  Head: Normocephalic and atraumatic.  Neck: Neck supple. No JVD present. No tracheal deviation present. No thyromegaly present.  Cardiovascular: Normal rate, regular rhythm and intact distal pulses.  Lymphadenopathy:    He has no cervical adenopathy.  Neurological: He is alert and oriented to  person, place, and time. He displays normal reflexes. No sensory deficit. He exhibits normal muscle tone. Coordination normal.  Skin: Skin is warm and dry. Capillary refill takes less than 2 seconds. No rash noted. He is not diaphoretic. No erythema. No pallor.  Psychiatric: He has a normal mood and affect. His behavior is normal. Judgment and thought content normal.    Ortho Exam Left shoulder no tenderness or deformity no swelling.  His active flexion is 150 degrees.  Supraspinatus strength grade 5/5 to manual muscle testing.  External rotation was 45 degrees.  He was stable in abduction external rotation without apprehension. Skin was warm dry and intact without erythema   Right shoulder active flexion 120 degrees pain tenderness over the glenohumeral joint.  External rotation was 30 degrees with his arm at his side.  He was stable in abduction external rotation.  Strength was grade 5 to manual muscle testing including all muscles of the rotator cuff.  Skin was warm dry and intact without erythema  Reflexes were 2+ and equal at the elbow including the brachioradialis.  Sensation was normal in both upper extremities.  Spurling sign was negative right and left  C-spine tender in the mid to lower midline with decreased rotation flexion and extension.  Medical decision-making  Imaging:   #1 shoulder OA  gleno humeral joint see my dictated report   # 2 C spine cervical spondylosis see dictated report   Encounter Diagnoses  Name Primary?  . Neck pain   . Acute pain of right shoulder   . Cervical spondylosis without myelopathy Yes  . Primary osteoarthritis of right shoulder     Start occupational therapy for the right shoulder arthritis and regular therapy for the cervical spondylosis  Precautions were given to the patient to call us back immediately if he has any gait disturbance or difficulty with fine motor tasks or severe numbness or tingling of his upper  extremities     Arther Abbott, MD 02/19/2017 10:29 AM

## 2017-02-19 NOTE — Patient Instructions (Addendum)
Continue naproxen and tramadol   Start PT at [facility] outpatient rehab Physical therapy has been ordered for you at [facility] [phone] is the phone number to call if you want to call to schedule. Please let us know if you do not hear anything within one week.

## 2017-02-21 DIAGNOSIS — D1801 Hemangioma of skin and subcutaneous tissue: Secondary | ICD-10-CM | POA: Diagnosis not present

## 2017-02-21 DIAGNOSIS — Z85828 Personal history of other malignant neoplasm of skin: Secondary | ICD-10-CM | POA: Diagnosis not present

## 2017-02-21 DIAGNOSIS — D2239 Melanocytic nevi of other parts of face: Secondary | ICD-10-CM | POA: Diagnosis not present

## 2017-02-21 DIAGNOSIS — L570 Actinic keratosis: Secondary | ICD-10-CM | POA: Diagnosis not present

## 2017-02-21 DIAGNOSIS — L821 Other seborrheic keratosis: Secondary | ICD-10-CM | POA: Diagnosis not present

## 2017-02-21 DIAGNOSIS — L720 Epidermal cyst: Secondary | ICD-10-CM | POA: Diagnosis not present

## 2017-02-21 DIAGNOSIS — D225 Melanocytic nevi of trunk: Secondary | ICD-10-CM | POA: Diagnosis not present

## 2017-02-22 ENCOUNTER — Ambulatory Visit (HOSPITAL_COMMUNITY): Payer: Medicare HMO | Attending: Orthopedic Surgery

## 2017-02-22 DIAGNOSIS — G8929 Other chronic pain: Secondary | ICD-10-CM | POA: Insufficient documentation

## 2017-02-22 DIAGNOSIS — R29898 Other symptoms and signs involving the musculoskeletal system: Secondary | ICD-10-CM | POA: Insufficient documentation

## 2017-02-22 DIAGNOSIS — M25611 Stiffness of right shoulder, not elsewhere classified: Secondary | ICD-10-CM | POA: Diagnosis present

## 2017-02-22 DIAGNOSIS — M542 Cervicalgia: Secondary | ICD-10-CM | POA: Diagnosis present

## 2017-02-22 DIAGNOSIS — M25511 Pain in right shoulder: Secondary | ICD-10-CM | POA: Diagnosis present

## 2017-02-22 NOTE — Therapy (Signed)
[facility] [place], [name], [address] Phone: [phone]   Fax:  [phone]  Occupational Therapy Evaluation  Patient Details  Name: Larry Graves MRN: [id number] Date of Birth: 1945-12-04 No Data Recorded  Encounter Date: 02/22/2017  OT End of Session - 02/22/17 2314    Visit Number  1    Number of Visits  12    Date for OT Re-Evaluation  04/05/17 mini reassess: 03/22/17    Authorization Type  [employer] Medicare    Authorization Time Period  before 10th visit    Authorization - Visit Number  1    Authorization - Number of Visits  10    OT Start Time  1118    OT Stop Time  1200    OT Time Calculation (min)  42 min    Activity Tolerance  Patient tolerated treatment well    Behavior During Therapy  [facility] for tasks assessed/performed       Past Medical History:  Diagnosis Date  . BPH (benign prostatic hyperplasia)   . Diabetes mellitus without complication ([place])   . Heart murmur   . History of alcoholism ([place])   . Hypertension     Past Surgical History:  Procedure Laterality Date  . [facility] in [place], [place] Bilateral [phone]   [facility] [place], [place] Left 2010?   [facility] in [place], [place]?   [facility] in [place], [place]     on face-all done in office   . NASAL SEPTUM SURGERY  1980's   [facility] [place], [place] Right 03/10/2014   Procedure: REPAIR QUADRICEP TENDON;  Surgeon: Carole Civil, MD;  Location: [facility];  Service: Orthopedics;  Laterality: Right;  . REPLACEMENT TOTAL KNEE Left 2000?   [facility] in [place]    There were no vitals filed for this visit.  Subjective Assessment - 02/22/17 2250    Subjective   S: It's probably been 30 years or more since I've had this pain.    Pertinent History  Patient is a  71 y/o male S/P right shoulder and neck pain which began approximately 30 years ago. Imaging was completed which showed severe cervical spondylosis, shoulder spur and decreased joint sclerosis. Dr. Aline Brochure has referred patient for occupational therapy for evaluation and treatment.     Special Tests  FOTO score: 53/100    Patient Stated Goals  To increase comfort level in right shoulder and neck.    Currently in Pain?  Yes    Pain Score  4     Pain Location  Shoulder    Pain Orientation  Right    Pain Descriptors / Indicators  Aching;Constant    Pain Type  Chronic pain    Pain Onset  More than a month ago    Pain Frequency  Constant    Aggravating Factors   reaching for belt behind back, reaching overhead, certain movements    Pain Relieving Factors  rest, heat    Effect of Pain on Daily Activities  min-mod effect        [facility] OT Assessment - 02/22/17 1128      Assessment   Medical Diagnosis  right shoulder pain and cervical spondolysis    Referring Provider  Arther Abbott,  MD    Onset Date/Surgical Date  -- 79 yeaers ago    Hand Dominance  Right    Next MD Visit  04/02/17 - follow up    Prior Therapy  None      Precautions   Precautions  None      Restrictions   Weight Bearing Restrictions  No      Balance Screen   Has the patient fallen in the past 6 months  Yes    How many times?  1 on slippery deck    Has the patient had a decrease in activity level because of a fear of falling?   No    Is the patient reluctant to leave their home because of a fear of falling?   No      Prior Function   Level of Independence  Independent    Vocation  Retired    Leisure  Enjoys fishing and hunting      ADL   ADL comments  Difficulty looking side to side, sometimes up and down, reaching overhead, putting belt on (internal rotation).      Mobility   Mobility Status  Independent      Written Expression   Dominant Hand  Right      Vision - History   Baseline Vision  Wears  glasses all the time      Cognition   Overall Cognitive Status  Within Functional Limits for tasks assessed      ROM / Strength   AROM / PROM / Strength  AROM;PROM;Strength      Palpation   Palpation comment  max fascial restrictions in bilateral upper trapezius region, right anterior deltoid, bilateral cervical region, and right scapularis region.       AROM   Overall AROM Comments  Assessed seated. IR/er abducted.    AROM Assessment Site  Shoulder;Cervical    Right/Left Shoulder  Right    Right Shoulder Flexion  120 Degrees    Right Shoulder ABduction  130 Degrees    Right Shoulder Internal Rotation  0 Degrees    Right Shoulder External Rotation  75 Degrees shoulder adducted: 34    Cervical Flexion  65    Cervical Extension  50    Cervical - Right Side Bend  45    Cervical - Left Side Bend  30    Cervical - Right Rotation  65    Cervical - Left Rotation  70      PROM   Overall PROM Comments  Asssesed supine. IR/er abducted    PROM Assessment Site  Shoulder;Cervical    Right/Left Shoulder  Right    Right Shoulder Flexion  135 Degrees    Right Shoulder ABduction  135 Degrees    Right Shoulder Internal Rotation  75 Degrees    Right Shoulder External Rotation  35 Degrees      Strength   Overall Strength Comments  Assessed seated. IR/er abducted.    Strength Assessment Site  Shoulder    Right/Left Shoulder  Right    Right Shoulder Flexion  5/5    Right Shoulder ABduction  5/5    Right Shoulder Internal Rotation  3+/5    Right Shoulder External Rotation  3+/5                      OT Education - 02/22/17 2311    Education provided  Yes    Education Details  Cervical A/ROM exercises, cervical stretches,  shoulder stretches    Person(s) Educated  Patient    Methods  Explanation;Demonstration;Verbal cues;Handout    Comprehension  Verbalized understanding       OT Short Term Goals - 02/22/17 2329      OT SHORT TERM GOAL #1   Title  Patient will be  educated and independent with HEP to increase functional performance and comfort level during daily tasks.     Time  3    Period  Weeks    Status  New    Target Date  03/15/17        OT Long Term Goals - 02/22/17 2331      OT LONG TERM GOAL #1   Title  Patient will report a increase in comfort level in right shoulder and cervical region that will allow him to participate in functional meaningful tasks with a decreased pain level.    Time  6    Period  Weeks    Status  New    Target Date  04/05/17      OT LONG TERM GOAL #2   Title  Patient will decrease fascial restrictions to min amount or less in right shoulder and cervical region to increase functional mobility needed to drive safely.    Time  6    Period  Weeks    Status  New      OT LONG TERM GOAL #3   Title  Patient will increase A/ROM in right UE and cervical region by 5-10 degrees in order to complete tasks such as reach overhead and behind back with decreased difficulty.     Time  6    Period  Weeks    Status  New            Plan - 02/22/17 2318    Clinical Impression Statement  A: Pt is a 71 y/o male S/P right shoulder and neck pain causing increased fascial restrictions and decrease strength and ROM resulting in difficulty completing daily tasks.    Occupational Profile and client history currently impacting functional performance  strong social support at home, motivated to return to prior level of function    Occupational performance deficits (Please refer to evaluation for details):  ADL's;Leisure;Rest and Sleep    Rehab Potential  Good    Current Impairments/barriers affecting progress:  chronic condition (30+ years)    OT Frequency  2x / week    OT Duration  6 weeks    OT Treatment/Interventions  Self-care/ADL training;Electrical Stimulation;Iontophoresis;Therapeutic exercise;Patient/family education;Passive range of motion;DME and/or AE instruction;Manual Therapy;Ultrasound;Cryotherapy;Therapeutic  activities;Moist Heat    Plan  P: Patient will benefit from skilled OT services to increase functional performance during daily tasks. Treatment Plan: myofascial release, manual stretching, isometric cervical exercises, A/ROM, functional mobility/reaching tasks.     Clinical Decision Making  Limited treatment options, no task modification necessary    Consulted and Agree with Plan of Care  Patient       Patient will benefit from skilled therapeutic intervention in order to improve the following deficits and impairments:  Pain, Increased fascial restrictions, Decreased range of motion, Decreased strength, Impaired UE functional use  Visit Diagnosis: Other symptoms and signs involving the musculoskeletal system - Plan: Ot plan of care cert/re-cert  Chronic right shoulder pain - Plan: Ot plan of care cert/re-cert  Stiffness of right shoulder, not elsewhere classified - Plan: Ot plan of care cert/re-cert  Cervicalgia - Plan: Ot plan of care cert/re-cert  G-Codes - 14/48/18 2345  Functional Assessment Tool Used (Outpatient only)  FOTO score: 53/100 (47% impaired)    Functional Limitation  Mobility: Walking and moving around    Mobility: Walking and Moving Around Current Status [phone])  At least 40 percent but less than 60 percent impaired, limited or restricted    Mobility: Walking and Moving Around Goal Status [phone])  At least 20 percent but less than 40 percent impaired, limited or restricted       Problem List Patient Active Problem List   Diagnosis Date Noted  . Quadriceps muscle rupture    Ailene Ravel, OTR/L,CBIS  [phone]   02/22/2017, 11:56 PM  [facility] [address] [place], [name], [address] Phone: [phone]   Fax:  [phone]  Name: Larry Graves MRN: [id number] Date of Birth: 1946/01/09

## 2017-02-22 NOTE — Patient Instructions (Signed)
Complete the following  1-2 times. 1 set.    AROM: Lateral Neck Flexion   Slowly tilt head toward one shoulder, then the other. Hold each position ____ seconds. Repeat ____ times per set. Do ____ sets per session. Do ____ sessions per day.  http://orth.exer.us/296   Copyright  VHI. All rights reserved.  AROM: Neck Extension   Bend head backward. Hold ____ seconds. Repeat ____ times per set. Do ____ sets per session. Do ____ sessions per day.  http://orth.exer.us/300   Copyright  VHI. All rights reserved.  AROM: Neck Flexion   Bend head forward. Hold ____ seconds. Repeat ____ times per set. Do ____ sets per session. Do ____ sessions per day.  http://orth.exer.us/298   Copyright  VHI. All rights reserved.  AROM: Neck Rotation   Turn head slowly to look over one shoulder, then the other. Hold each position ____ seconds. Repeat ____ times per set. Do ____ sets per session. Do ____ sessions per day.  http://orth.exer.us/294   Copyright  VHI. All rights reserved.   Sternocleidomastoid (SCM) Stretch  In sitting, place one hand over the front of your collarbone, then extend your head backwards and to each side. Hold 10 seconds. Repeat 2 times.   Flexibility: Neck Stretch   Grasp left arm above wrist and pull down across body while gently tilting head same direction. Hold for 3 seconds. Repeat 10 times.    Flexibility: Neck Retraction   Pull head straight back, keeping eyes and jaw level. *Give yourself a double chin.* Hold 3 seconds. Repeat 10 times.      Internal Rotation Across Back  Grab the end of a towel with your affected side, palm facing backwards. Grab the towel with your unaffected side and pull your affected hand across your back until you feel a stretch in the front of your shoulder. If you feel pain, pull just to the pain, do not pull through the pain. Hold. Return your affected arm to your side. Try to keep your hand/arm close to your body  during the entire movement.     Hold for 10-15 seconds. Complete 2 times.            Wall Flexion  Slide your arm up the wall or door frame until a stretch is felt in your shoulder . Hold for 10-15 seconds. Complete 2 times     Shoulder Abduction Stretch  Stand side ways by a wall with affected up on wall. Gently step in toward wall to feel stretch. Hold for 10-15 seconds. Complete 2 times.

## 2017-02-25 ENCOUNTER — Ambulatory Visit (HOSPITAL_COMMUNITY): Payer: Medicare HMO

## 2017-02-25 DIAGNOSIS — M25611 Stiffness of right shoulder, not elsewhere classified: Secondary | ICD-10-CM

## 2017-02-25 DIAGNOSIS — M542 Cervicalgia: Secondary | ICD-10-CM

## 2017-02-25 DIAGNOSIS — R29898 Other symptoms and signs involving the musculoskeletal system: Secondary | ICD-10-CM | POA: Diagnosis not present

## 2017-02-25 DIAGNOSIS — G8929 Other chronic pain: Secondary | ICD-10-CM

## 2017-02-25 DIAGNOSIS — M25511 Pain in right shoulder: Secondary | ICD-10-CM

## 2017-02-25 NOTE — Therapy (Signed)
[place], [name], [address] Phone: [phone]   Fax:  [phone]  Occupational Therapy Treatment  Patient Details  Name: Larry Graves MRN: [id number] Date of Birth: 01-04-1946 No Data Recorded  Encounter Date: 02/25/2017  OT End of Session - 02/25/17 0923    Visit Number  2    Number of Visits  12    Date for OT Re-Evaluation  04/05/17 mini reassess: 03/22/17    Authorization Type  [employer] Medicare    Authorization Time Period  before 10th visit    Authorization - Visit Number  2    Authorization - Number of Visits  10    OT Start Time  0820    OT Stop Time  0900    OT Time Calculation (min)  40 min    Activity Tolerance  Patient tolerated treatment well    Behavior During Therapy  [facility] for tasks assessed/performed       Past Medical History:  Diagnosis Date  . BPH (benign prostatic hyperplasia)   . Diabetes mellitus without complication ([place])   . Heart murmur   . History of alcoholism ([place])   . Hypertension     Past Surgical History:  Procedure Laterality Date  . [place] in [place], [place] Bilateral [phone]   [facility] [place], [place] Left 2010?   [facility] in [place], [place]?   [facility] in [place], [place]     on face-all done in office   . NASAL SEPTUM SURGERY  1980's   [facility] [place], [place] Right 03/10/2014   Procedure: REPAIR QUADRICEP TENDON;  Surgeon: Carole Civil, MD;  Location: [facility];  Service: Orthopedics;  Laterality: Right;  . REPLACEMENT TOTAL KNEE Left 2000?   [facility] in [place]    There were no vitals filed for this visit.  Subjective Assessment - 02/25/17 0842    Subjective   S: Those exercises have really helped already.    Currently in Pain?  Yes    Pain Score  2     Pain  Location  Shoulder    Pain Orientation  Right    Pain Descriptors / Indicators  Aching;Constant    Pain Type  Chronic pain    Multiple Pain Sites  Yes    Pain Score  1    Pain Location  Shoulder    Pain Orientation  Posterior    Pain Descriptors / Indicators  Aching;Constant    Pain Type  Chronic pain    Pain Radiating Towards  N/A    Pain Onset  More than a month ago    Pain Frequency  Constant    Aggravating Factors   Certain movements    Pain Relieving Factors  rest, heat    Effect of Pain on Daily Activities  min-mod effect         [facility] OT Assessment - 02/25/17 0843      Assessment   Medical Diagnosis  right shoulder pain and cervical spondolysis      Precautions   Precautions  None               OT Treatments/Exercises (OP) - 02/25/17 0844      Exercises   Exercises  Shoulder;Neck  Shoulder Exercises: Supine   Protraction  PROM;5 reps    Horizontal ABduction  PROM;5 reps    External Rotation  PROM;5 reps abducted    Internal Rotation  PROM;5 reps abducted    Flexion  PROM;5 reps    ABduction  PROM;5 reps      Shoulder Exercises: Standing   Extension  Theraband;10 reps    Theraband Level (Shoulder Extension)  Level 2 (Red)    Row  Theraband;10 reps    Theraband Level (Shoulder Row)  Level 2 (Red)    Retraction  Theraband;10 reps    Theraband Level (Shoulder Retraction)  Level 2 (Red)      Shoulder Exercises: ROM/Strengthening   UBE (Upper Arm Bike)  Level 1 3' reverse    X to V Arms  10X      Manual Therapy   Manual Therapy  Myofascial release    Manual therapy comments  Manua therapy completed prior to exercises.    Myofascial Release  Myofascial release and manual stretching completed to right upper arm, trapezius, scapular and bilateral cervical regions to decrease fascial restrictions and increase joint mobility in a pain free zone.              OT Education - 02/25/17 0859    Education provided  Yes    Education Details  Pt  was given OT evaluation print out. Reviewed plan of care and goals.     Person(s) Educated  Patient    Methods  Explanation;Handout    Comprehension  Verbalized understanding       OT Short Term Goals - 02/25/17 0847      OT SHORT TERM GOAL #1   Title  Patient will be educated and independent with HEP to increase functional performance and comfort level during daily tasks.     Time  3    Period  Weeks    Status  On-going        OT Long Term Goals - 02/25/17 0847      OT LONG TERM GOAL #1   Title  Patient will report a increase in comfort level in right shoulder and cervical region that will allow him to participate in functional meaningful tasks with a decreased pain level.    Time  6    Period  Weeks    Status  On-going      OT LONG TERM GOAL #2   Title  Patient will decrease fascial restrictions to min amount or less in right shoulder and cervical region to increase functional mobility needed to drive safely.    Time  6    Period  Weeks    Status  On-going      OT LONG TERM GOAL #3   Title  Patient will increase A/ROM in right UE and cervical region by 5-10 degrees in order to complete tasks such as reach overhead and behind back with decreased difficulty.     Time  6    Period  Weeks    Status  On-going      OT LONG TERM GOAL #4   Title  Patient will increase IR/er shoulder strength to 4/5 with signs of increased shoulder stability while completing exercises at the [facility].    Time  6    Period  Weeks    Status  New            Plan - 02/25/17 1025    Clinical Impression Statement  A: Initiated myofascial release,  manual stretching, A/ROM shoulder exercises to focus on right shoulder ROM. VC for form and technique.    Plan  P: Continue with increasing A/ROM of right shoulder. Add neck stretches, seated hughston (H1, H3, H4).    Consulted and Agree with Plan of Care  Patient       Patient will benefit from skilled therapeutic intervention in order to improve  the following deficits and impairments:  Pain, Increased fascial restrictions, Decreased range of motion, Decreased strength, Impaired UE functional use  Visit Diagnosis: Cervicalgia  Stiffness of right shoulder, not elsewhere classified  Chronic right shoulder pain  Other symptoms and signs involving the musculoskeletal system    Problem List Patient Active Problem List   Diagnosis Date Noted  . Quadriceps muscle rupture    Ailene Ravel, OTR/L,CBIS  [phone]  02/25/2017, 9:27 AM  [place] [address] [place], [name], [address] Phone: [phone]   Fax:  [phone]  Name: Larry Graves MRN: [id number] Date of Birth: 15-Jan-1946

## 2017-02-27 ENCOUNTER — Ambulatory Visit (HOSPITAL_COMMUNITY): Payer: Medicare HMO

## 2017-02-27 ENCOUNTER — Encounter (HOSPITAL_COMMUNITY): Payer: Self-pay

## 2017-02-27 DIAGNOSIS — R29898 Other symptoms and signs involving the musculoskeletal system: Secondary | ICD-10-CM

## 2017-02-27 DIAGNOSIS — M542 Cervicalgia: Secondary | ICD-10-CM

## 2017-02-27 DIAGNOSIS — M25511 Pain in right shoulder: Secondary | ICD-10-CM

## 2017-02-27 DIAGNOSIS — M25611 Stiffness of right shoulder, not elsewhere classified: Secondary | ICD-10-CM

## 2017-02-27 DIAGNOSIS — G8929 Other chronic pain: Secondary | ICD-10-CM

## 2017-02-27 NOTE — Therapy (Signed)
[place], [name], [address] Phone: [phone]   Fax:  [phone]  Occupational Therapy Treatment  Patient Details  Name: Larry Graves MRN: [id number] Date of Birth: 07-Aug-1945 No Data Recorded  Encounter Date: 02/27/2017  OT End of Session - 02/27/17 0856    Visit Number  3    Number of Visits  12    Date for OT Re-Evaluation  04/05/17 mini reassess: 03/22/17    Authorization Type  [employer] Medicare    Authorization Time Period  before 10th visit    Authorization - Visit Number  3    Authorization - Number of Visits  10    OT Start Time  [phone]    OT Stop Time  0900    OT Time Calculation (min)  43 min    Activity Tolerance  Patient tolerated treatment well    Behavior During Therapy  [facility] for tasks assessed/performed       Past Medical History:  Diagnosis Date  . BPH (benign prostatic hyperplasia)   . Diabetes mellitus without complication ([place])   . Heart murmur   . History of alcoholism ([place])   . Hypertension     Past Surgical History:  Procedure Laterality Date  . [place] in [place], [place] Bilateral [phone]   [facility] in [place], [place] Left 2010?   [facility] in [place], [place]?   [facility] in [place], [place]     on face-all done in office   . NASAL SEPTUM SURGERY  1980's   [facility] [place] Right 03/10/2014   Procedure: REPAIR QUADRICEP TENDON;  Surgeon: Carole Civil, MD;  Location: [facility];  Service: Orthopedics;  Laterality: Right;  . REPLACEMENT TOTAL KNEE Left 2000?   [facility] in [place]    There were no vitals filed for this visit.  Subjective Assessment - 02/27/17 0837    Subjective   S: It's actually feeling good.    Currently in Pain?  Yes    Pain Score  1     Pain Location   Shoulder neck    Pain Orientation  Right    Pain Descriptors / Indicators  Aching;Constant    Pain Type  Chronic pain                   OT Treatments/Exercises (OP) - 02/27/17 0841      Exercises   Exercises  Shoulder;Neck      Shoulder Exercises: Supine   Protraction  PROM;5 reps    Horizontal ABduction  PROM;5 reps    External Rotation  PROM;5 reps abducted    Internal Rotation  PROM;5 reps abducted    Flexion  PROM;5 reps    ABduction  PROM;5 reps      Shoulder Exercises: Standing   Extension  Theraband;12 reps    Theraband Level (Shoulder Extension)  Level 2 (Red)    Row  Theraband;12 reps    Theraband Level (Shoulder Row)  Level 2 (Red)    Retraction  Theraband;12 reps    Theraband Level (Shoulder Retraction)  Level 2 (Red)    Other Standing Exercises  Hughston exercises; H1, H3, H4      Shoulder Exercises: ROM/Strengthening   UBE (Upper Arm Bike)  Level 2 3' reverse    X to V Arms  10X             OT Education - 02/27/17 0855    Education provided  Yes    Education Details  Pt was given red theraband for scapular strengthening    Person(s) Educated  Patient    Methods  Explanation;Demonstration;Handout;Verbal cues;Tactile cues    Comprehension  Returned demonstration;Verbalized understanding       OT Short Term Goals - 02/25/17 0847      OT SHORT TERM GOAL #1   Title  Patient will be educated and independent with HEP to increase functional performance and comfort level during daily tasks.     Time  3    Period  Weeks    Status  On-going        OT Long Term Goals - 02/27/17 0254      OT LONG TERM GOAL #1   Title  Patient will report a increase in comfort level in right shoulder and cervical region that will allow him to participate in functional meaningful tasks with a decreased pain level.    Time  6    Period  Weeks    Status  On-going      OT LONG TERM GOAL #2   Title  Patient will decrease fascial restrictions to min amount or  less in right shoulder and cervical region to increase functional mobility needed to drive safely.    Time  6    Period  Weeks    Status  On-going      OT LONG TERM GOAL #3   Title  Patient will increase A/ROM in right UE and cervical region by 5-10 degrees in order to complete tasks such as reach overhead and behind back with decreased difficulty.     Time  6    Period  Weeks    Status  On-going      OT LONG TERM GOAL #4   Title  Patient will increase IR/er shoulder strength to 4/5 with signs of increased shoulder stability while completing exercises at the [facility].    Time  6    Period  Weeks    Status  On-going            Plan - 02/27/17 2706    Clinical Impression Statement  A: Completed Hughston standing exercises to focus on scapular mobility and strength. Patient required physical and verbal cuing and had max difficulty with technique and form. Pt had the most difficulty with H4. Overall, patient reports that his pain as significantly decreased just by completing his HEP.    Plan  P: Continue focusing on functional mobility within patient's range. Focus on scapular strengthening. Refrain from completing H4 at this time due to poor strength and technique.       Patient will benefit from skilled therapeutic intervention in order to improve the following deficits and impairments:  Pain, Increased fascial restrictions, Decreased range of motion, Decreased strength, Impaired UE functional use  Visit Diagnosis: Cervicalgia  Other symptoms and signs involving the musculoskeletal system  Chronic right shoulder pain  Stiffness of right shoulder, not elsewhere classified    Problem List Patient Active Problem List   Diagnosis Date Noted  . Quadriceps muscle rupture    Ailene Ravel, OTR/L,CBIS  [phone]   02/27/2017, 9:23 AM  [place] [address] [place], [name], [address] Phone: [phone]   Fax:  [phone]  Name: Larry Graves MRN: [id number] Date of Birth: [birth year]

## 2017-02-27 NOTE — Patient Instructions (Signed)

## 2017-03-08 ENCOUNTER — Encounter (HOSPITAL_COMMUNITY): Payer: Self-pay | Admitting: Specialist

## 2017-03-08 ENCOUNTER — Ambulatory Visit (HOSPITAL_COMMUNITY): Payer: Medicare HMO | Admitting: Specialist

## 2017-03-08 DIAGNOSIS — G8929 Other chronic pain: Secondary | ICD-10-CM

## 2017-03-08 DIAGNOSIS — M542 Cervicalgia: Secondary | ICD-10-CM

## 2017-03-08 DIAGNOSIS — M25611 Stiffness of right shoulder, not elsewhere classified: Secondary | ICD-10-CM

## 2017-03-08 DIAGNOSIS — R29898 Other symptoms and signs involving the musculoskeletal system: Secondary | ICD-10-CM

## 2017-03-08 DIAGNOSIS — M25511 Pain in right shoulder: Secondary | ICD-10-CM

## 2017-03-08 NOTE — Therapy (Signed)
[place], [name], [address] Phone: [phone]   Fax:  [phone]  Occupational Therapy Treatment  Patient Details  Name: Larry Graves MRN: [id number] Date of Birth: 27-Feb-1946 No Data Recorded  Encounter Date: 03/08/2017  OT End of Session - 03/08/17 1445    Visit Number  4    Number of Visits  12    Date for OT Re-Evaluation  04/05/17 mini reassess on 03/22/17    Authorization Type  [employer]ET[employer] Medicare    Authorization Time Period  before 10th visit    Authorization - Visit Number  4    Authorization - Number of Visits  10    OT Start Time  8502    OT Stop Time  1430    OT Time Calculation (min)  42 min    Activity Tolerance  Patient tolerated treatment well    Behavior During Therapy  [facility] for tasks assessed/performed       Past Medical History:  Diagnosis Date  . BPH (benign prostatic hyperplasia)   . Diabetes mellitus without complication ([place])   . Heart murmur   . History of alcoholism ([place])   . Hypertension     Past Surgical History:  Procedure Laterality Date  . [facility] in [place], [place] Bilateral [phone]   [facility] in [place], [place] Left 2010?   [facility] in [place], [place]?   [facility] in [place], [place]     on face-all done in office   . NASAL SEPTUM SURGERY  1980's   [facility] [place], [place] Right 03/10/2014   Procedure: REPAIR QUADRICEP TENDON;  Surgeon: Carole Civil, MD;  Location: [facility];  Service: Orthopedics;  Laterality: Right;  . REPLACEMENT TOTAL KNEE Left 2000?   [facility] in [place]    There were no vitals filed for this visit.  Subjective Assessment - 03/08/17 1353    Subjective   S:  Compared to where I started I am doing awesome.  I still cant move it as far as the left.      Currently in Pain?  Yes    Pain Score  1     Pain Location  Shoulder    Pain Orientation  Right    Pain Descriptors / Indicators  Aching         [facility] OT Assessment - 03/08/17 0001      Assessment   Medical Diagnosis  right shoulder pain and cervical spondolysis               OT Treatments/Exercises (OP) - 03/08/17 0001      Exercises   Exercises  Shoulder;Neck      Shoulder Exercises: Supine   Protraction  PROM;5 reps;AROM;15 reps    Horizontal ABduction  PROM;5 reps;AROM;15 reps    External Rotation  PROM;5 reps;AROM;15 reps    Internal Rotation  PROM;5 reps;AROM;15 reps    Flexion  PROM;5 reps;AROM;15 reps    ABduction  PROM;5 reps;AROM;15 reps      Shoulder Exercises: ROM/Strengthening   "W" Arms  10X    X to V Arms  10X    Proximal Shoulder Strengthening, Supine  10X    Proximal Shoulder Strengthening, Seated  10X     Other  ROM/Strengthening Exercises  holding medium green therapy ball between both hands:  chest press, overhead press, overhead v, pull up 10 times each       Manual Therapy   Manual Therapy  Myofascial release    Manual therapy comments  Manua therapy completed prior to exercises.    Myofascial Release  Myofascial release and manual stretching completed to right upper arm, trapezius, scapular and bilateral cervical regions to decrease fascial restrictions and increase joint mobility in a pain free zone.                OT Short Term Goals - 02/25/17 0847      OT SHORT TERM GOAL #1   Title  Patient will be educated and independent with HEP to increase functional performance and comfort level during daily tasks.     Time  3    Period  Weeks    Status  On-going        OT Long Term Goals - 02/27/17 1610      OT LONG TERM GOAL #1   Title  Patient will report a increase in comfort level in right shoulder and cervical region that will allow him to participate in functional meaningful tasks with a decreased pain level.    Time  6     Period  Weeks    Status  On-going      OT LONG TERM GOAL #2   Title  Patient will decrease fascial restrictions to min amount or less in right shoulder and cervical region to increase functional mobility needed to drive safely.    Time  6    Period  Weeks    Status  On-going      OT LONG TERM GOAL #3   Title  Patient will increase A/ROM in right UE and cervical region by 5-10 degrees in order to complete tasks such as reach overhead and behind back with decreased difficulty.     Time  6    Period  Weeks    Status  On-going      OT LONG TERM GOAL #4   Title  Patient will increase IR/er shoulder strength to 4/5 with signs of increased shoulder stability while completing exercises at the [facility].    Time  6    Period  Weeks    Status  On-going            Plan - 03/08/17 1445    Clinical Impression Statement  A:  Patient continuing to improve A/ROM in right shoulder region.  internal rotation is most limited, and performed passive stretch with arm 30 degrees abducted to improve stretch and mobility needed to tuck shirt in and don belt.  added bilateral ball exercises for improved shoulder symmetry wtih overhead reach, these exercises were a great challenge for patient.     Plan  P:  focus on functional mobility within patients range.  continue therapy ball exercises. add sidelying exercises.        Patient will benefit from skilled therapeutic intervention in order to improve the following deficits and impairments:  Pain, Increased fascial restrictions, Decreased range of motion, Decreased strength, Impaired UE functional use  Visit Diagnosis: Cervicalgia  Other symptoms and signs involving the musculoskeletal system  Chronic right shoulder pain  Stiffness of right shoulder, not elsewhere classified    Problem List Patient Active Problem List   Diagnosis Date Noted  . Quadriceps muscle rupture     Vangie Bicker, MHA, OTR/L  [phone]  03/08/2017, 2:58  PM  [place] [address] [place], [name], [address] Phone: [phone]   Fax:  [phone]  Name: Larry Graves MRN: [id number] Date of Birth: 15-Feb-1946

## 2017-03-11 ENCOUNTER — Encounter (HOSPITAL_COMMUNITY): Payer: Self-pay | Admitting: Specialist

## 2017-03-11 ENCOUNTER — Ambulatory Visit (HOSPITAL_COMMUNITY): Payer: Medicare HMO | Admitting: Specialist

## 2017-03-11 DIAGNOSIS — M542 Cervicalgia: Secondary | ICD-10-CM

## 2017-03-11 DIAGNOSIS — R29898 Other symptoms and signs involving the musculoskeletal system: Secondary | ICD-10-CM

## 2017-03-11 DIAGNOSIS — M25611 Stiffness of right shoulder, not elsewhere classified: Secondary | ICD-10-CM

## 2017-03-11 NOTE — Therapy (Signed)
[place], [name], [address] Phone: [phone]   Fax:  [phone]  Occupational Therapy Treatment  Patient Details  Name: Larry Graves MRN: [id number] Date of Birth: 05-26-1945 No Data Recorded  Encounter Date: 03/11/2017  OT End of Session - 03/11/17 1029    Visit Number  5    Number of Visits  12    Date for OT Re-Evaluation  04/05/17 mini reassess on 03/22/17    Authorization Type  AETNA Medicare    Authorization Time Period  before 10th visit    Authorization - Visit Number  5    Authorization - Number of Visits  10    OT Start Time  [phone]    OT Stop Time  1032    OT Time Calculation (min)  40 min    Activity Tolerance  Patient tolerated treatment well    Behavior During Therapy  [facility] for tasks assessed/performed       Past Medical History:  Diagnosis Date  . BPH (benign prostatic hyperplasia)   . Diabetes mellitus without complication ([place])   . Heart murmur   . History of alcoholism ([place])   . Hypertension     Past Surgical History:  Procedure Laterality Date  . [place] in [place], [place] Bilateral [phone]   [facility] in [place], [place] Left 2010?   [facility] in [place], [place]?   [facility] in [place], [place]     on face-all done in office   . NASAL SEPTUM SURGERY  1980's   [facility] [place], [place] Right 03/10/2014   Procedure: REPAIR QUADRICEP TENDON;  Surgeon: Carole Civil, MD;  Location: [facility];  Service: Orthopedics;  Laterality: Right;  . REPLACEMENT TOTAL KNEE Left 2000?   [facility] in [place]    There were no vitals filed for this visit.  Subjective Assessment - 03/11/17 1029    Subjective   S:  My wrist flares up once in a while and today it hurts.      Currently in Pain?  Yes    Pain  Score  5     Pain Location  Wrist    Pain Orientation  Right    Pain Descriptors / Indicators  Aching         [facility] OT Assessment - 03/11/17 0001      Assessment   Medical Diagnosis  right shoulder pain and cervical spondolysis               OT Treatments/Exercises (OP) - 03/11/17 0001      Exercises   Exercises  Shoulder;Neck      Shoulder Exercises: Supine   Protraction  PROM;5 reps;Strengthening;10 reps    Protraction Weight (lbs)  1    Horizontal ABduction  PROM;5 reps;Strengthening;10 reps    Horizontal ABduction Weight (lbs)  1    External Rotation  PROM;5 reps;Strengthening;10 reps    External Rotation Weight (lbs)  1    Internal Rotation  PROM;5 reps;Strengthening;10 reps    Internal Rotation Weight (lbs)  1    Flexion  PROM;5 reps;Strengthening;10 reps    Shoulder Flexion Weight (lbs)  1    ABduction  PROM;5 reps;Strengthening;10 reps    Shoulder ABduction Weight (lbs)  1  Shoulder Exercises: Sidelying   External Rotation  Strengthening;10 reps    External Rotation Weight (lbs)  1    Internal Rotation  Strengthening;10 reps    Internal Rotation Weight (lbs)  1    Flexion  Strengthening;10 reps    Flexion Weight (lbs)  1    ABduction  Strengthening;10 reps    ABduction Weight (lbs)  1      Shoulder Exercises: Standing   Protraction  AROM;15 reps    Horizontal ABduction  AROM;15 reps    External Rotation  AROM;15 reps    Internal Rotation  AROM;15 reps    Flexion  AROM;15 reps    ABduction  AROM;15 reps      Shoulder Exercises: ROM/Strengthening   UBE (Upper Arm Bike)  Level 2 3' reverse    "W" Arms  10X    X to V Arms  10X    Proximal Shoulder Strengthening, Supine  10X with 1#    Proximal Shoulder Strengthening, Seated  10X    Other ROM/Strengthening Exercises  holding medium green therapy ball between both hands:  chest press, overhead press, overhead v, pull up 12 times each       Manual Therapy   Manual Therapy  Myofascial release     Manual therapy comments  Manua therapy completed prior to exercises.    Myofascial Release  Myofascial release and manual stretching completed to right upper arm, trapezius, scapular and bilateral cervical regions to decrease fascial restrictions and increase joint mobility in a pain free zone.                OT Short Term Goals - 02/25/17 0847      OT SHORT TERM GOAL #1   Title  Patient will be educated and independent with HEP to increase functional performance and comfort level during daily tasks.     Time  3    Period  Weeks    Status  On-going        OT Long Term Goals - 02/27/17 8144      OT LONG TERM GOAL #1   Title  Patient will report a increase in comfort level in right shoulder and cervical region that will allow him to participate in functional meaningful tasks with a decreased pain level.    Time  6    Period  Weeks    Status  On-going      OT LONG TERM GOAL #2   Title  Patient will decrease fascial restrictions to min amount or less in right shoulder and cervical region to increase functional mobility needed to drive safely.    Time  6    Period  Weeks    Status  On-going      OT LONG TERM GOAL #3   Title  Patient will increase A/ROM in right UE and cervical region by 5-10 degrees in order to complete tasks such as reach overhead and behind back with decreased difficulty.     Time  6    Period  Weeks    Status  On-going      OT LONG TERM GOAL #4   Title  Patient will increase IR/er shoulder strength to 4/5 with signs of increased shoulder stability while completing exercises at the [facility].    Time  6    Period  Weeks    Status  On-going            Plan - 03/11/17 1030    Clinical  Impression Statement  A: added 1# to supine exercises, and added sidelying strengthening.  greater symmetry in movement of right shoulder this date.      Plan  P:  add wall push up and cybex press row, prone strengthening for greater scapular stabiilty required for  overhead reaching.        Patient will benefit from skilled therapeutic intervention in order to improve the following deficits and impairments:  Pain, Increased fascial restrictions, Decreased range of motion, Decreased strength, Impaired UE functional use  Visit Diagnosis: Cervicalgia  Other symptoms and signs involving the musculoskeletal system  Stiffness of right shoulder, not elsewhere classified    Problem List Patient Active Problem List   Diagnosis Date Noted  . Quadriceps muscle rupture     Vangie Bicker, [place], OTR/L [phone]  03/11/2017, 10:32 AM  [place] [address] [place], [name], [address] Phone: [phone]   Fax:  [phone]  Name: Larry Graves MRN: [id number] Date of Birth: 1945-06-16

## 2017-03-13 ENCOUNTER — Ambulatory Visit (HOSPITAL_COMMUNITY): Payer: Medicare HMO | Attending: Orthopedic Surgery

## 2017-03-13 ENCOUNTER — Encounter (HOSPITAL_COMMUNITY): Payer: Self-pay

## 2017-03-13 ENCOUNTER — Ambulatory Visit: Payer: Medicare HMO | Admitting: Orthotics

## 2017-03-13 DIAGNOSIS — R29898 Other symptoms and signs involving the musculoskeletal system: Secondary | ICD-10-CM | POA: Diagnosis not present

## 2017-03-13 DIAGNOSIS — M25611 Stiffness of right shoulder, not elsewhere classified: Secondary | ICD-10-CM | POA: Diagnosis not present

## 2017-03-13 DIAGNOSIS — M542 Cervicalgia: Secondary | ICD-10-CM | POA: Insufficient documentation

## 2017-03-13 DIAGNOSIS — G8929 Other chronic pain: Secondary | ICD-10-CM | POA: Diagnosis not present

## 2017-03-13 DIAGNOSIS — M722 Plantar fascial fibromatosis: Secondary | ICD-10-CM

## 2017-03-13 DIAGNOSIS — M25511 Pain in right shoulder: Secondary | ICD-10-CM | POA: Diagnosis not present

## 2017-03-13 NOTE — Therapy (Signed)
[place], [name], [address] Phone: [phone]   Fax:  [phone]  Occupational Therapy Treatment  Patient Details  Name: Larry Graves MRN: [id number] Date of Birth: [birth year] No Data Recorded  Encounter Date: 03/13/2017  OT End of Session - 03/13/17 1429    Visit Number  6    Number of Visits  12    Date for OT Re-Evaluation  04/05/17 mini reassess on 03/22/17    Authorization Type  [employer]ET[employer] Medicare    Authorization Time Period  before 10th visit    Authorization - Visit Number  6    Authorization - Number of Visits  10    OT Start Time  1345    OT Stop Time  1430    OT Time Calculation (min)  45 min    Activity Tolerance  Patient tolerated treatment well    Behavior During Therapy  [facility] for tasks assessed/performed       Past Medical History:  Diagnosis Date  . BPH (benign prostatic hyperplasia)   . Diabetes mellitus without complication ([place])   . Heart murmur   . History of alcoholism ([place])   . Hypertension     Past Surgical History:  Procedure Laterality Date  . [facility] in [place], [place] Bilateral [phone]   [facility] in [place], [place] Left 2010?   [facility] in [place], [place]?   [facility] in [place], [place]     on face-all done in office   . NASAL SEPTUM SURGERY  1980's   [facility] [place], [place] Right 03/10/2014   Procedure: REPAIR QUADRICEP TENDON;  Surgeon: Carole Civil, MD;  Location: [facility];  Service: Orthopedics;  Laterality: Right;  . REPLACEMENT TOTAL KNEE Left 2000?   [facility] in [place]    There were no vitals filed for this visit.  Subjective Assessment - 03/13/17 1405    Subjective   S: My neck and shoulder are feeling a lot better. I see the doctor on the 22nd.    Currently in Pain?   Yes    Pain Score  1     Pain Location  Shoulder    Pain Orientation  Right    Pain Descriptors / Indicators  Aching    Pain Type  Chronic pain    Pain Radiating Towards  N/A    Pain Onset  More than a month ago    Pain Frequency  Intermittent    Aggravating Factors   certain movements (external rotation with arm adducted)    Pain Relieving Factors  rest, heat    Effect of Pain on Daily Activities  min effect    Multiple Pain Sites  No                   OT Treatments/Exercises (OP) - 03/13/17 1408      Exercises   Exercises  Shoulder;Neck      Shoulder Exercises: Supine   Protraction  PROM;5 reps;Strengthening;10 reps    Protraction Weight (lbs)  2    Horizontal ABduction  PROM;5 reps;Strengthening;10 reps    Horizontal ABduction Weight (lbs)  2    External Rotation  PROM;5 reps;Strengthening;10 reps    External Rotation Weight (lbs)  2  Internal Rotation  PROM;5 reps;Strengthening;10 reps    Internal Rotation Weight (lbs)  2      Shoulder Exercises: Prone   Flexion  Strengthening;10 reps    Flexion Weight (lbs)  2    Horizontal ABduction 1  Strengthening;10 reps    Horizontal ABduction 1 Weight (lbs)  2    Other Prone Exercises  [place] exercises; A/ROM; H1    Other Prone Exercises  Y arm/scaption; 2#; 10X      Shoulder Exercises: Standing   Horizontal ABduction  Strengthening;10 reps    Horizontal ABduction Weight (lbs)  2    Flexion  Strengthening;10 reps    Shoulder Flexion Weight (lbs)  2    ABduction  Strengthening;10 reps    Shoulder ABduction Weight (lbs)  2      Shoulder Exercises: ROM/Strengthening   Cybex Row  3 plate;15 reps    Over Head Lace  2'    Proximal Shoulder Strengthening, Supine  10X with 2#    Ball on Wall  1' flexion 1' abduction               OT Short Term Goals - 02/25/17 0847      OT SHORT TERM GOAL #1   Title  Patient will be educated and independent with HEP to increase functional performance and comfort  level during daily tasks.     Time  3    Period  Weeks    Status  On-going        OT Long Term Goals - 02/27/17 6712      OT LONG TERM GOAL #1   Title  Patient will report a increase in comfort level in right shoulder and cervical region that will allow him to participate in functional meaningful tasks with a decreased pain level.    Time  6    Period  Weeks    Status  On-going      OT LONG TERM GOAL #2   Title  Patient will decrease fascial restrictions to min amount or less in right shoulder and cervical region to increase functional mobility needed to drive safely.    Time  6    Period  Weeks    Status  On-going      OT LONG TERM GOAL #3   Title  Patient will increase A/ROM in right UE and cervical region by 5-10 degrees in order to complete tasks such as reach overhead and behind back with decreased difficulty.     Time  6    Period  Weeks    Status  On-going      OT LONG TERM GOAL #4   Title  Patient will increase IR/er shoulder strength to 4/5 with signs of increased shoulder stability while completing exercises at the [facility].    Time  6    Period  Weeks    Status  On-going            Plan - 03/13/17 1432    Clinical Impression Statement  A: Increased strengthening to 2# this session. Attempted hughston exercises prone although patient was only able to complete H1 as he did not have the scapular/shoulder mobility for correct form and technique.    Plan  P: Increase sidelying strengthening weight. Add wall push ups       Patient will benefit from skilled therapeutic intervention in order to improve the following deficits and impairments:  Pain, Increased fascial restrictions, Decreased range of motion, Decreased strength, Impaired UE  functional use  Visit Diagnosis: Cervicalgia  Other symptoms and signs involving the musculoskeletal system  Stiffness of right shoulder, not elsewhere classified  Chronic right shoulder pain    Problem List Patient Active  Problem List   Diagnosis Date Noted  . Quadriceps muscle rupture    Ailene Ravel, OTR/L,CBIS  [phone]  03/13/2017, 2:34 PM  [facility] [address] [place], [name], [address] Phone: [phone]   Fax:  [phone]  Name: WILDER KUROWSKI MRN: [id number] Date of Birth: 25-Oct-1945

## 2017-03-13 NOTE — Progress Notes (Signed)
Patient came into today for casting bilateral f/o to address plantar fasciitis.  Patient reports history of foot pain involving plantar aponeurosis.  Goal is to provide longitudinal arch support and correct any RF instability due to heel eversion/inversion.  Ultimate goal is to relieve tension at pf insertion calcaneal tuberosity.  Plan on semi-rigid device addressing heel stability and relieving PF tension.      $300

## 2017-03-18 ENCOUNTER — Encounter (HOSPITAL_COMMUNITY): Payer: Self-pay | Admitting: Specialist

## 2017-03-18 ENCOUNTER — Ambulatory Visit (HOSPITAL_COMMUNITY): Payer: Medicare HMO | Admitting: Specialist

## 2017-03-18 DIAGNOSIS — M25611 Stiffness of right shoulder, not elsewhere classified: Secondary | ICD-10-CM

## 2017-03-18 DIAGNOSIS — R29898 Other symptoms and signs involving the musculoskeletal system: Secondary | ICD-10-CM

## 2017-03-18 NOTE — Therapy (Signed)
[place], [name], [address] Phone: [phone]   Fax:  [phone]  Occupational Therapy Treatment  Patient Details  Name: Larry Graves MRN: [id number] Date of Birth: 1945/11/27 No Data Recorded  Encounter Date: 03/18/2017  OT End of Session - 03/18/17 0952    Visit Number  7    Number of Visits  12    Date for OT Re-Evaluation  04/05/17 mini reassess on 1/11    Authorization Type  AET[employer] Medicare    Authorization Time Period  before 10th visit    Authorization - Visit Number  7    Authorization - Number of Visits  10    OT Start Time  [phone]    OT Stop Time  0950    OT Time Calculation (min)  46 min       Past Medical History:  Diagnosis Date  . BPH (benign prostatic hyperplasia)   . Diabetes mellitus without complication ([place])   . Heart murmur   . History of alcoholism ([place])   . Hypertension     Past Surgical History:  Procedure Laterality Date  . [facility] in [place], [place] Bilateral [phone]   [facility] [place], [place] Left 2010?   [facility] in [place], [place]?   [facility] in [place], [place]     on face-all done in office   . NASAL SEPTUM SURGERY  1980's   [facility] [place], [place] Right 03/10/2014   Procedure: REPAIR QUADRICEP TENDON;  Surgeon: Carole Civil, MD;  Location: [facility];  Service: Orthopedics;  Laterality: Right;  . REPLACEMENT TOTAL KNEE Left 2000?   [facility] in [place]    There were no vitals filed for this visit.  Subjective Assessment - 03/18/17 0952    Subjective   S:  I have good days and bad.  I was busy this weekend making sausage.     Currently in Pain?  Yes    Pain Score  1     Pain Location  Shoulder    Pain Orientation  Right    Pain Descriptors / Indicators  Aching    Pain  Type  Chronic pain         [facility] OT Assessment - 03/18/17 0001      Assessment   Medical Diagnosis  right shoulder pain and cervical spondolysis               OT Treatments/Exercises (OP) - 03/18/17 0001      Exercises   Exercises  Shoulder;Neck      Shoulder Exercises: Supine   Protraction  PROM;5 reps;Strengthening;15 reps    Protraction Weight (lbs)  2    Horizontal ABduction  PROM;5 reps;Strengthening;15 reps    Horizontal ABduction Weight (lbs)  2    External Rotation  PROM;5 reps;Strengthening;15 reps    External Rotation Weight (lbs)  2    Internal Rotation  PROM;5 reps;Strengthening;15 reps    Internal Rotation Weight (lbs)  2    Flexion  PROM;5 reps;Strengthening;15 reps    Shoulder Flexion Weight (lbs)  2    ABduction  PROM;5 reps;Strengthening;15 reps    Shoulder ABduction Weight (lbs)  2      Shoulder Exercises: Sidelying   External Rotation  Strengthening;10 reps    External Rotation Weight (lbs)  2    Internal Rotation  Strengthening;10 reps    Internal Rotation Weight (lbs)  2    Flexion  Strengthening;10 reps    Flexion Weight (lbs)  2    ABduction  Strengthening;10 reps    ABduction Weight (lbs)  2      Shoulder Exercises: Standing   Protraction  Strengthening;10 reps    Protraction Weight (lbs)  2    Horizontal ABduction  Strengthening;10 reps    Horizontal ABduction Weight (lbs)  2    External Rotation  Strengthening;10 reps    External Rotation Weight (lbs)  2    Internal Rotation  Strengthening;10 reps    Internal Rotation Weight (lbs)  2    Flexion  Strengthening;10 reps    Shoulder Flexion Weight (lbs)  1    ABduction  Strengthening;10 reps    Shoulder ABduction Weight (lbs)  1      Shoulder Exercises: ROM/Strengthening   UBE (Upper Arm Bike)  Level 2 3' reverse and 3' forward    Cybex Press  3 plate;15 reps    Cybex Row  3 plate;15 reps    Wall Pushups  10 reps    "W" Arms  10 times with 1#    X to V Arms  10 times with 1#     Proximal Shoulder Strengthening, Supine  10X with 2#    Proximal Shoulder Strengthening, Seated  10 X with 1#    Other ROM/Strengthening Exercises  resume next session      Manual Therapy   Manual Therapy  Myofascial release    Manual therapy comments  Manua therapy completed prior to exercises.    Myofascial Release  Myofascial release and manual stretching completed to right upper arm, trapezius, scapular and bilateral cervical regions to decrease fascial restrictions and increase joint mobility in a pain free zone.                OT Short Term Goals - 02/25/17 0847      OT SHORT TERM GOAL #1   Title  Patient will be educated and independent with HEP to increase functional performance and comfort level during daily tasks.     Time  3    Period  Weeks    Status  On-going        OT Long Term Goals - 02/27/17 4174      OT LONG TERM GOAL #1   Title  Patient will report a increase in comfort level in right shoulder and cervical region that will allow him to participate in functional meaningful tasks with a decreased pain level.    Time  6    Period  Weeks    Status  On-going      OT LONG TERM GOAL #2   Title  Patient will decrease fascial restrictions to min amount or less in right shoulder and cervical region to increase functional mobility needed to drive safely.    Time  6    Period  Weeks    Status  On-going      OT LONG TERM GOAL #3   Title  Patient will increase A/ROM in right UE and cervical region by 5-10 degrees in order to complete tasks such as reach overhead and behind back with decreased difficulty.     Time  6    Period  Weeks    Status  On-going      OT  LONG TERM GOAL #4   Title  Patient will increase IR/er shoulder strength to 4/5 with signs of increased shoulder stability while completing exercises at the [facility].    Time  6    Period  Weeks    Status  On-going            Plan - 03/18/17 6734    Clinical Impression Statement  A:  Patient  able to complete strengthening in sidelying with 2# weight this date, attempted in standing and able to complete, except for flexion and abduction.  Strength and sustained activity tolerance continuing to improve.  Due to work completed over the weekend, paitent had fascial restriction in rhomboid muscle, MFR able to decease amount of restrictions    Plan  P:  Resume therapy ball exercises for improved sustained activity tolerance with overhead activities, attempt 2# with standing flexion and abduction strengthening.        Patient will benefit from skilled therapeutic intervention in order to improve the following deficits and impairments:  Pain, Increased fascial restrictions, Decreased range of motion, Decreased strength, Impaired UE functional use  Visit Diagnosis: Stiffness of right shoulder, not elsewhere classified  Other symptoms and signs involving the musculoskeletal system    Problem List Patient Active Problem List   Diagnosis Date Noted  . Quadriceps muscle rupture     Vangie Bicker, [place], OTR/L [phone]  03/18/2017, 9:56 AM  [facility] [address] [place], [name], [address] Phone: [phone]   Fax:  [phone]  Name: CHANDLER SWIDERSKI MRN: [id number] Date of Birth: 06-10-1945

## 2017-03-20 ENCOUNTER — Ambulatory Visit (HOSPITAL_COMMUNITY): Payer: Medicare HMO

## 2017-03-20 DIAGNOSIS — R29898 Other symptoms and signs involving the musculoskeletal system: Secondary | ICD-10-CM

## 2017-03-20 DIAGNOSIS — M542 Cervicalgia: Secondary | ICD-10-CM | POA: Diagnosis not present

## 2017-03-20 DIAGNOSIS — M25511 Pain in right shoulder: Secondary | ICD-10-CM | POA: Diagnosis not present

## 2017-03-20 DIAGNOSIS — G8929 Other chronic pain: Secondary | ICD-10-CM

## 2017-03-20 DIAGNOSIS — M25611 Stiffness of right shoulder, not elsewhere classified: Secondary | ICD-10-CM | POA: Diagnosis not present

## 2017-03-20 NOTE — Therapy (Signed)
[facility] [address] [place], [name], [address] Phone: [phone]   Fax:  [phone]  Occupational Therapy Treatment  Patient Details  Name: Larry Graves MRN: [id number] Date of Birth: 1945/12/27 No Data Recorded  Encounter Date: 03/20/2017  OT End of Session - 03/20/17 1001    Visit Number  8    Number of Visits  12    Authorization Type  AETNA Medicare    OT Start Time  0905 reassessment/Discharge    OT Stop Time  0945    OT Time Calculation (min)  40 min    Activity Tolerance  Patient tolerated treatment well    Behavior During Therapy  [facility] for tasks assessed/performed       Past Medical History:  Diagnosis Date  . BPH (benign prostatic hyperplasia)   . Diabetes mellitus without complication ([place])   . Heart murmur   . History of alcoholism ([place])   . Hypertension     Past Surgical History:  Procedure Laterality Date  . [facility] in [place], [place] Bilateral [phone]   [facility] in [place], [place] Left 2010?   [facility] [place], [place]?   [facility] in [place], [place]     on face-all done in office   . NASAL SEPTUM SURGERY  1980's   [facility] [place], [place] Right 03/10/2014   Procedure: REPAIR QUADRICEP TENDON;  Surgeon: Carole Civil, MD;  Location: [facility];  Service: Orthopedics;  Laterality: Right;  . REPLACEMENT TOTAL KNEE Left 2000?   [facility] in [place]    There were no vitals filed for this visit.  Subjective Assessment - 03/20/17 0959    Subjective   S: I do my neck stretches everyday.     Currently in Pain?  Yes    Pain Score  1     Pain Location  Shoulder    Pain Orientation  Right    Pain Descriptors / Indicators  Aching    Pain Type  Chronic pain    Pain Radiating Towards  N/A    Pain Onset  More than a  month ago    Pain Frequency  Intermittent    Aggravating Factors   certain movements    Pain Relieving Factors  rest, heat    Effect of Pain on Daily Activities  min effect    Multiple Pain Sites  No         [facility] OT Assessment - 03/20/17 0911      Assessment   Medical Diagnosis  right shoulder pain and cervical spondolysis    Referring Provider  Arther Abbott, MD      Precautions   Precautions  None      AROM   Overall AROM Comments  Assessed seated. IR/er abducted.    AROM Assessment Site  Shoulder;Cervical    Right/Left Shoulder  Right    Right Shoulder Flexion  140 Degrees previous: 140    Right Shoulder ABduction  130 Degrees previous: same    Right Shoulder Internal Rotation  75 Degrees previous: 75    Right Shoulder External Rotation  30 Degrees previous: 0    Cervical Extension  60 previous: 50    Cervical - Left Side Bend  30 previous: same  Cervical - Right Rotation  80 previuos: 65    Cervical - Left Rotation  80 previous: 70      PROM   Overall PROM Comments  Asssesed supine. IR/er abducted    PROM Assessment Site  Shoulder    Right/Left Shoulder  Right    Right Shoulder Flexion  130 Degrees previous: 135    Right Shoulder ABduction  154 Degrees previous: 135    Right Shoulder Internal Rotation  55 Degrees previous: 75    Right Shoulder External Rotation  85 Degrees previous: 35      Strength   Overall Strength Comments  Assessed seated. IR/er abducted.    Strength Assessment Site  Shoulder    Right/Left Shoulder  Right    Right Shoulder Internal Rotation  5/5 previous: 3+/5    Right Shoulder External Rotation  5/5 previous: 3+/5                       OT Education - 03/20/17 1000    Education provided  Yes    Education Details  Shoulder strengthening, reviwed goals and HEP. Made recommendations to continue HEP at home. Discussed frequency and weight adjustment when returning to the [facility].    Person(s) Educated  Patient    Methods   Explanation;Handout    Comprehension  Verbalized understanding       OT Short Term Goals - 03/20/17 0937      OT SHORT TERM GOAL #1   Title  Patient will be educated and independent with HEP to increase functional performance and comfort level during daily tasks.     Time  3    Period  Weeks    Status  Achieved        OT Long Term Goals - 03/20/17 8563      OT LONG TERM GOAL #1   Title  Patient will report a increase in comfort level in right shoulder and cervical region that will allow him to participate in functional meaningful tasks with a decreased pain level.    Time  6    Period  Weeks    Status  Achieved      OT LONG TERM GOAL #2   Title  Patient will decrease fascial restrictions to min amount or less in right shoulder and cervical region to increase functional mobility needed to drive safely.    Time  6    Period  Weeks    Status  Achieved      OT LONG TERM GOAL #3   Title  Patient will increase A/ROM in right UE and cervical region by 5-10 degrees in order to complete tasks such as reach overhead and behind back with decreased difficulty.     Time  6    Period  Weeks    Status  Achieved      OT LONG TERM GOAL #4   Title  Patient will increase IR/er shoulder strength to 4/5 with signs of increased shoulder stability while completing exercises at the [facility].    Time  6    Period  Weeks    Status  Achieved            Plan - 03/20/17 1001    Clinical Impression Statement  A: Reassessment completed this date. patient has met all therapy goals at this time. Pt reports that he believes he is 80% better. He has times when he will be more sore depending on what activities he  completes. HEP was updated. Patient is in agreement with discharge.     Plan  P: D/C from therapy with HEP.    Consulted and Agree with Plan of Care  Patient       Patient will benefit from skilled therapeutic intervention in order to improve the following deficits and impairments:  Pain,  Increased fascial restrictions, Decreased range of motion, Decreased strength, Impaired UE functional use  Visit Diagnosis: Stiffness of right shoulder, not elsewhere classified  Cervicalgia  Chronic right shoulder pain  Other symptoms and signs involving the musculoskeletal system    Problem List Patient Active Problem List   Diagnosis Date Noted  . Quadriceps muscle rupture    .OCCUPATIONAL THERAPY DISCHARGE SUMMARY  Visits from Start of Care: 8  Current functional level related to goals / functional outcomes: See above   Remaining deficits: See above   Education / Equipment: See above Plan: Patient agrees to discharge.  Patient goals were met. Patient is being discharged due to meeting the stated rehab goals.  ?????        Ailene Ravel, OTR/L,CBIS  [phone]  03/20/2017, 10:30 AM  [place], [name], [address] Phone: [phone]   Fax:  [phone]  Name: TAKARI LUNDAHL MRN: [id number] Date of Birth: 08-18-1945

## 2017-03-20 NOTE — Patient Instructions (Signed)
Repeat all exercises 10-15 times, 1-2 times per day.  1) Shoulder Protraction    Begin with elbows by your side, slowly "punch" straight out in front of you.      2) Shoulder Flexion  Standing:         Begin with arms at your side with thumbs pointed up, slowly raise both arms up and forward towards overhead.       3) Horizontal abduction/adduction   Standing:           Begin with arms straight out in front of you, bring out to the side in at "T" shape. Keep arms straight entire time.        4) Internal & External Rotation    *No band* -Stand with elbows at the side and elbows bent 90 degrees. Move your forearms away from your body, then bring back inward toward the body.     5) Shoulder Abduction   Standing:       Lying on your back begin with your arms flat on the table next to your side. Slowly move your arms out to the side so that they go overhead, in a jumping jack or snow angel movement.    6) X to V arms (cheerleader move):  Begin with arms straight down, crossed in front of body in an "X". Keeping arms crossed, lift arms straight up overhead. Then spread arms apart into a "V" shape.  Bring back together into x and lower down to starting position.    

## 2017-03-25 ENCOUNTER — Encounter (HOSPITAL_COMMUNITY): Payer: Medicare HMO | Admitting: Specialist

## 2017-03-27 ENCOUNTER — Encounter (HOSPITAL_COMMUNITY): Payer: Medicare HMO

## 2017-04-01 ENCOUNTER — Encounter (HOSPITAL_COMMUNITY): Payer: Medicare HMO

## 2017-04-02 ENCOUNTER — Ambulatory Visit: Payer: Medicare HMO | Admitting: Orthopedic Surgery

## 2017-04-03 ENCOUNTER — Encounter: Payer: Medicare HMO | Admitting: Orthotics

## 2017-04-03 ENCOUNTER — Encounter (HOSPITAL_COMMUNITY): Payer: Medicare HMO

## 2017-04-08 ENCOUNTER — Encounter (HOSPITAL_COMMUNITY): Payer: Medicare HMO

## 2017-04-09 DIAGNOSIS — M722 Plantar fascial fibromatosis: Secondary | ICD-10-CM

## 2017-04-10 ENCOUNTER — Encounter (HOSPITAL_COMMUNITY): Payer: Medicare HMO

## 2017-04-15 ENCOUNTER — Ambulatory Visit (INDEPENDENT_AMBULATORY_CARE_PROVIDER_SITE_OTHER): Payer: Medicare HMO | Admitting: Orthopedic Surgery

## 2017-04-15 ENCOUNTER — Encounter: Payer: Self-pay | Admitting: Orthopedic Surgery

## 2017-04-15 VITALS — BP 167/97 | HR 70 | Ht 63.0 in | Wt 181.0 lb

## 2017-04-15 DIAGNOSIS — M19011 Primary osteoarthritis, right shoulder: Secondary | ICD-10-CM

## 2017-04-15 DIAGNOSIS — M47812 Spondylosis without myelopathy or radiculopathy, cervical region: Secondary | ICD-10-CM

## 2017-04-15 NOTE — Progress Notes (Signed)
Chief Complaint  Patient presents with  . Follow-up    Recheck on right shoulder.   Secondary complaint neck pain degenerative disc disease  Problem #1 recheck right shoulder patient says he improved significantly with range of motion exercises right shoulder he was able to do most activities and outdoor work that he wanted  As far as his neck goes problem #2 he says he has no pain stiffness or gait disturbance numbness or tingling in his upper extremities  Review of systems is negative for constitutional symptoms or weight loss fever chills radiculopathy numbness tingling unsteady gait weakness  Past Medical History:  Diagnosis Date  . BPH (benign prostatic hyperplasia)   . Diabetes mellitus without complication ([place])   . Heart murmur   . History of alcoholism ([place])   . Hypertension    BP (!) 167/97   Pulse 70   Ht 5\' 3"  (1.6 m)   Wt 181 lb (82.1 kg)   BMI 32.06 kg/m   Overall appearance is normal he is oriented x3 his mood and affect is pleasant right shoulder flexion 120 no tenderness shoulder stable rotator cuff strength normal skin intact pulses good sensation normal  Cervical spine slight decreased rotational motion but no or negative Spurling's  Encounter Diagnoses  Name Primary?  . Cervical spondylosis without myelopathy Yes  . Primary osteoarthritis of right shoulder     Both problems stable recommend one-year follow-up with x-ray shoulder plus or minus cervical spine if cervical spine symptomatic

## 2017-04-17 ENCOUNTER — Ambulatory Visit: Payer: Medicare HMO | Admitting: Orthotics

## 2017-04-17 DIAGNOSIS — M722 Plantar fascial fibromatosis: Secondary | ICD-10-CM

## 2017-04-17 NOTE — Progress Notes (Signed)
Patient came in today to pick up custom made foot orthotics.  The goals were accomplished and the patient reported no dissatisfaction with said orthotics.  Patient was advised of breakin period and how to report any issues.  $300

## 2017-04-29 DIAGNOSIS — H02054 Trichiasis without entropian left upper eyelid: Secondary | ICD-10-CM | POA: Diagnosis not present

## 2017-04-29 DIAGNOSIS — H11122 Conjunctival concretions, left eye: Secondary | ICD-10-CM | POA: Diagnosis not present

## 2017-05-27 DIAGNOSIS — H5203 Hypermetropia, bilateral: Secondary | ICD-10-CM | POA: Diagnosis not present

## 2017-05-27 DIAGNOSIS — H35371 Puckering of macula, right eye: Secondary | ICD-10-CM | POA: Diagnosis not present

## 2017-05-27 DIAGNOSIS — H2513 Age-related nuclear cataract, bilateral: Secondary | ICD-10-CM | POA: Diagnosis not present

## 2017-05-27 DIAGNOSIS — H524 Presbyopia: Secondary | ICD-10-CM | POA: Diagnosis not present

## 2017-07-22 DIAGNOSIS — I10 Essential (primary) hypertension: Secondary | ICD-10-CM | POA: Diagnosis not present

## 2017-07-22 DIAGNOSIS — E119 Type 2 diabetes mellitus without complications: Secondary | ICD-10-CM | POA: Diagnosis not present

## 2017-07-22 DIAGNOSIS — E782 Mixed hyperlipidemia: Secondary | ICD-10-CM | POA: Diagnosis not present

## 2017-07-24 DIAGNOSIS — E119 Type 2 diabetes mellitus without complications: Secondary | ICD-10-CM | POA: Diagnosis not present

## 2017-07-24 DIAGNOSIS — I10 Essential (primary) hypertension: Secondary | ICD-10-CM | POA: Diagnosis not present

## 2017-07-24 DIAGNOSIS — M1990 Unspecified osteoarthritis, unspecified site: Secondary | ICD-10-CM | POA: Diagnosis not present

## 2017-07-24 DIAGNOSIS — Z6833 Body mass index (BMI) 33.0-33.9, adult: Secondary | ICD-10-CM | POA: Diagnosis not present

## 2017-07-24 DIAGNOSIS — N400 Enlarged prostate without lower urinary tract symptoms: Secondary | ICD-10-CM | POA: Diagnosis not present

## 2017-07-24 DIAGNOSIS — M5030 Other cervical disc degeneration, unspecified cervical region: Secondary | ICD-10-CM | POA: Diagnosis not present

## 2017-07-24 DIAGNOSIS — E782 Mixed hyperlipidemia: Secondary | ICD-10-CM | POA: Diagnosis not present

## 2017-07-31 DIAGNOSIS — E119 Type 2 diabetes mellitus without complications: Secondary | ICD-10-CM | POA: Diagnosis not present

## 2017-08-07 DIAGNOSIS — L570 Actinic keratosis: Secondary | ICD-10-CM | POA: Diagnosis not present

## 2017-08-07 DIAGNOSIS — L814 Other melanin hyperpigmentation: Secondary | ICD-10-CM | POA: Diagnosis not present

## 2017-08-07 DIAGNOSIS — L720 Epidermal cyst: Secondary | ICD-10-CM | POA: Diagnosis not present

## 2017-08-07 DIAGNOSIS — D225 Melanocytic nevi of trunk: Secondary | ICD-10-CM | POA: Diagnosis not present

## 2017-08-07 DIAGNOSIS — Z85828 Personal history of other malignant neoplasm of skin: Secondary | ICD-10-CM | POA: Diagnosis not present

## 2017-10-29 DIAGNOSIS — E119 Type 2 diabetes mellitus without complications: Secondary | ICD-10-CM | POA: Diagnosis not present

## 2017-10-31 DIAGNOSIS — E119 Type 2 diabetes mellitus without complications: Secondary | ICD-10-CM | POA: Diagnosis not present

## 2017-11-29 DIAGNOSIS — H02052 Trichiasis without entropian right lower eyelid: Secondary | ICD-10-CM | POA: Diagnosis not present

## 2017-12-13 ENCOUNTER — Ambulatory Visit (INDEPENDENT_AMBULATORY_CARE_PROVIDER_SITE_OTHER): Payer: Medicare HMO | Admitting: Urology

## 2017-12-13 DIAGNOSIS — R361 Hematospermia: Secondary | ICD-10-CM

## 2017-12-13 DIAGNOSIS — N401 Enlarged prostate with lower urinary tract symptoms: Secondary | ICD-10-CM | POA: Diagnosis not present

## 2017-12-20 DIAGNOSIS — R7301 Impaired fasting glucose: Secondary | ICD-10-CM | POA: Diagnosis not present

## 2017-12-20 DIAGNOSIS — N400 Enlarged prostate without lower urinary tract symptoms: Secondary | ICD-10-CM | POA: Diagnosis not present

## 2017-12-20 DIAGNOSIS — E782 Mixed hyperlipidemia: Secondary | ICD-10-CM | POA: Diagnosis not present

## 2017-12-20 DIAGNOSIS — I10 Essential (primary) hypertension: Secondary | ICD-10-CM | POA: Diagnosis not present

## 2017-12-20 DIAGNOSIS — R361 Hematospermia: Secondary | ICD-10-CM | POA: Diagnosis not present

## 2017-12-20 DIAGNOSIS — E119 Type 2 diabetes mellitus without complications: Secondary | ICD-10-CM | POA: Diagnosis not present

## 2017-12-25 DIAGNOSIS — N400 Enlarged prostate without lower urinary tract symptoms: Secondary | ICD-10-CM | POA: Diagnosis not present

## 2017-12-25 DIAGNOSIS — E782 Mixed hyperlipidemia: Secondary | ICD-10-CM | POA: Diagnosis not present

## 2017-12-25 DIAGNOSIS — M5136 Other intervertebral disc degeneration, lumbar region: Secondary | ICD-10-CM | POA: Diagnosis not present

## 2017-12-25 DIAGNOSIS — Z0000 Encounter for general adult medical examination without abnormal findings: Secondary | ICD-10-CM | POA: Diagnosis not present

## 2017-12-25 DIAGNOSIS — E1169 Type 2 diabetes mellitus with other specified complication: Secondary | ICD-10-CM | POA: Diagnosis not present

## 2017-12-25 DIAGNOSIS — M1990 Unspecified osteoarthritis, unspecified site: Secondary | ICD-10-CM | POA: Diagnosis not present

## 2017-12-25 DIAGNOSIS — Z6831 Body mass index (BMI) 31.0-31.9, adult: Secondary | ICD-10-CM | POA: Diagnosis not present

## 2017-12-25 DIAGNOSIS — I10 Essential (primary) hypertension: Secondary | ICD-10-CM | POA: Diagnosis not present

## 2017-12-26 ENCOUNTER — Other Ambulatory Visit (HOSPITAL_COMMUNITY): Payer: Self-pay | Admitting: Adult Health Nurse Practitioner

## 2017-12-26 DIAGNOSIS — M5030 Other cervical disc degeneration, unspecified cervical region: Secondary | ICD-10-CM

## 2017-12-26 DIAGNOSIS — R202 Paresthesia of skin: Secondary | ICD-10-CM

## 2018-01-07 ENCOUNTER — Ambulatory Visit (HOSPITAL_COMMUNITY)
Admission: RE | Admit: 2018-01-07 | Discharge: 2018-01-07 | Disposition: A | Discharge status: Home / Self Care | Payer: Medicare HMO | Source: Ambulatory Visit | Attending: Adult Health Nurse Practitioner | Admitting: Adult Health Nurse Practitioner

## 2018-01-07 DIAGNOSIS — M5030 Other cervical disc degeneration, unspecified cervical region: Secondary | ICD-10-CM

## 2018-01-07 DIAGNOSIS — R202 Paresthesia of skin: Secondary | ICD-10-CM | POA: Diagnosis not present

## 2018-01-07 DIAGNOSIS — M542 Cervicalgia: Secondary | ICD-10-CM | POA: Diagnosis not present

## 2018-01-07 DIAGNOSIS — M47812 Spondylosis without myelopathy or radiculopathy, cervical region: Secondary | ICD-10-CM | POA: Diagnosis not present

## 2018-01-16 DIAGNOSIS — M25511 Pain in right shoulder: Secondary | ICD-10-CM | POA: Diagnosis not present

## 2018-01-16 DIAGNOSIS — M5136 Other intervertebral disc degeneration, lumbar region: Secondary | ICD-10-CM | POA: Diagnosis not present

## 2018-01-16 DIAGNOSIS — M47816 Spondylosis without myelopathy or radiculopathy, lumbar region: Secondary | ICD-10-CM | POA: Diagnosis not present

## 2018-01-16 DIAGNOSIS — M19011 Primary osteoarthritis, right shoulder: Secondary | ICD-10-CM | POA: Diagnosis not present

## 2018-01-16 DIAGNOSIS — M545 Low back pain: Secondary | ICD-10-CM | POA: Diagnosis not present

## 2018-01-16 DIAGNOSIS — M47812 Spondylosis without myelopathy or radiculopathy, cervical region: Secondary | ICD-10-CM | POA: Diagnosis not present

## 2018-01-17 DIAGNOSIS — Z23 Encounter for immunization: Secondary | ICD-10-CM | POA: Diagnosis not present

## 2018-01-22 DIAGNOSIS — M542 Cervicalgia: Secondary | ICD-10-CM | POA: Diagnosis not present

## 2018-01-22 DIAGNOSIS — M25511 Pain in right shoulder: Secondary | ICD-10-CM | POA: Diagnosis not present

## 2018-01-22 DIAGNOSIS — M6281 Muscle weakness (generalized): Secondary | ICD-10-CM | POA: Diagnosis not present

## 2018-01-22 DIAGNOSIS — M2560 Stiffness of unspecified joint, not elsewhere classified: Secondary | ICD-10-CM | POA: Diagnosis not present

## 2018-01-24 DIAGNOSIS — M6281 Muscle weakness (generalized): Secondary | ICD-10-CM | POA: Diagnosis not present

## 2018-01-24 DIAGNOSIS — M25511 Pain in right shoulder: Secondary | ICD-10-CM | POA: Diagnosis not present

## 2018-01-24 DIAGNOSIS — M2560 Stiffness of unspecified joint, not elsewhere classified: Secondary | ICD-10-CM | POA: Diagnosis not present

## 2018-01-24 DIAGNOSIS — M542 Cervicalgia: Secondary | ICD-10-CM | POA: Diagnosis not present

## 2018-01-27 DIAGNOSIS — E119 Type 2 diabetes mellitus without complications: Secondary | ICD-10-CM | POA: Diagnosis not present

## 2018-01-28 DIAGNOSIS — M542 Cervicalgia: Secondary | ICD-10-CM | POA: Diagnosis not present

## 2018-01-28 DIAGNOSIS — M25511 Pain in right shoulder: Secondary | ICD-10-CM | POA: Diagnosis not present

## 2018-01-28 DIAGNOSIS — M2560 Stiffness of unspecified joint, not elsewhere classified: Secondary | ICD-10-CM | POA: Diagnosis not present

## 2018-01-28 DIAGNOSIS — M6281 Muscle weakness (generalized): Secondary | ICD-10-CM | POA: Diagnosis not present

## 2018-01-29 DIAGNOSIS — Z85828 Personal history of other malignant neoplasm of skin: Secondary | ICD-10-CM | POA: Diagnosis not present

## 2018-01-29 DIAGNOSIS — L720 Epidermal cyst: Secondary | ICD-10-CM | POA: Diagnosis not present

## 2018-01-29 DIAGNOSIS — L814 Other melanin hyperpigmentation: Secondary | ICD-10-CM | POA: Diagnosis not present

## 2018-01-29 DIAGNOSIS — L821 Other seborrheic keratosis: Secondary | ICD-10-CM | POA: Diagnosis not present

## 2018-01-29 DIAGNOSIS — C44319 Basal cell carcinoma of skin of other parts of face: Secondary | ICD-10-CM | POA: Diagnosis not present

## 2018-01-30 DIAGNOSIS — M25511 Pain in right shoulder: Secondary | ICD-10-CM | POA: Diagnosis not present

## 2018-01-30 DIAGNOSIS — M2560 Stiffness of unspecified joint, not elsewhere classified: Secondary | ICD-10-CM | POA: Diagnosis not present

## 2018-01-30 DIAGNOSIS — M542 Cervicalgia: Secondary | ICD-10-CM | POA: Diagnosis not present

## 2018-01-30 DIAGNOSIS — M6281 Muscle weakness (generalized): Secondary | ICD-10-CM | POA: Diagnosis not present

## 2018-02-05 DIAGNOSIS — M542 Cervicalgia: Secondary | ICD-10-CM | POA: Diagnosis not present

## 2018-02-05 DIAGNOSIS — M25511 Pain in right shoulder: Secondary | ICD-10-CM | POA: Diagnosis not present

## 2018-02-05 DIAGNOSIS — M2560 Stiffness of unspecified joint, not elsewhere classified: Secondary | ICD-10-CM | POA: Diagnosis not present

## 2018-02-05 DIAGNOSIS — M6281 Muscle weakness (generalized): Secondary | ICD-10-CM | POA: Diagnosis not present

## 2018-02-11 DIAGNOSIS — M542 Cervicalgia: Secondary | ICD-10-CM | POA: Diagnosis not present

## 2018-02-11 DIAGNOSIS — M2560 Stiffness of unspecified joint, not elsewhere classified: Secondary | ICD-10-CM | POA: Diagnosis not present

## 2018-02-11 DIAGNOSIS — M25511 Pain in right shoulder: Secondary | ICD-10-CM | POA: Diagnosis not present

## 2018-02-11 DIAGNOSIS — M6281 Muscle weakness (generalized): Secondary | ICD-10-CM | POA: Diagnosis not present

## 2018-02-12 DIAGNOSIS — Z85828 Personal history of other malignant neoplasm of skin: Secondary | ICD-10-CM | POA: Diagnosis not present

## 2018-02-12 DIAGNOSIS — C44319 Basal cell carcinoma of skin of other parts of face: Secondary | ICD-10-CM | POA: Diagnosis not present

## 2018-02-13 DIAGNOSIS — M6281 Muscle weakness (generalized): Secondary | ICD-10-CM | POA: Diagnosis not present

## 2018-02-13 DIAGNOSIS — M2560 Stiffness of unspecified joint, not elsewhere classified: Secondary | ICD-10-CM | POA: Diagnosis not present

## 2018-02-13 DIAGNOSIS — M542 Cervicalgia: Secondary | ICD-10-CM | POA: Diagnosis not present

## 2018-02-13 DIAGNOSIS — M25511 Pain in right shoulder: Secondary | ICD-10-CM | POA: Diagnosis not present

## 2018-02-18 DIAGNOSIS — M6281 Muscle weakness (generalized): Secondary | ICD-10-CM | POA: Diagnosis not present

## 2018-02-18 DIAGNOSIS — M25511 Pain in right shoulder: Secondary | ICD-10-CM | POA: Diagnosis not present

## 2018-02-18 DIAGNOSIS — M542 Cervicalgia: Secondary | ICD-10-CM | POA: Diagnosis not present

## 2018-02-18 DIAGNOSIS — M2560 Stiffness of unspecified joint, not elsewhere classified: Secondary | ICD-10-CM | POA: Diagnosis not present

## 2018-02-25 DIAGNOSIS — M542 Cervicalgia: Secondary | ICD-10-CM | POA: Diagnosis not present

## 2018-02-25 DIAGNOSIS — M25511 Pain in right shoulder: Secondary | ICD-10-CM | POA: Diagnosis not present

## 2018-02-25 DIAGNOSIS — M6281 Muscle weakness (generalized): Secondary | ICD-10-CM | POA: Diagnosis not present

## 2018-02-25 DIAGNOSIS — M2560 Stiffness of unspecified joint, not elsewhere classified: Secondary | ICD-10-CM | POA: Diagnosis not present

## 2018-04-14 ENCOUNTER — Ambulatory Visit: Payer: Self-pay | Admitting: Orthopedic Surgery

## 2019-02-13 ENCOUNTER — Ambulatory Visit (INDEPENDENT_AMBULATORY_CARE_PROVIDER_SITE_OTHER): Payer: Medicare Other | Admitting: Urology

## 2019-02-13 ENCOUNTER — Other Ambulatory Visit: Payer: Self-pay

## 2019-02-13 DIAGNOSIS — R361 Hematospermia: Secondary | ICD-10-CM

## 2019-02-13 DIAGNOSIS — R3915 Urgency of urination: Secondary | ICD-10-CM | POA: Diagnosis not present

## 2019-02-13 DIAGNOSIS — N401 Enlarged prostate with lower urinary tract symptoms: Secondary | ICD-10-CM | POA: Diagnosis not present

## 2019-03-18 ENCOUNTER — Telehealth: Payer: Self-pay | Admitting: Urology

## 2019-03-18 NOTE — Telephone Encounter (Signed)
Pt called and states he is having issues with his prostate and the meds prescribed are not helping. He states he wants to see a dr in the next few days. I told him I could not add to the schedule but I would have the nurse return his call to triage his situation.

## 2019-03-20 NOTE — Telephone Encounter (Signed)
Called No answer.

## 2019-03-24 NOTE — Telephone Encounter (Signed)
I spoke with patient and offered him An appt for Feb. 5th at 3:00. He agreed to change his March appt.

## 2019-03-24 NOTE — Telephone Encounter (Signed)
Called patient   Left message to call back

## 2019-04-17 ENCOUNTER — Other Ambulatory Visit: Payer: Self-pay

## 2019-04-17 ENCOUNTER — Encounter: Payer: Self-pay | Admitting: Urology

## 2019-04-17 ENCOUNTER — Ambulatory Visit (INDEPENDENT_AMBULATORY_CARE_PROVIDER_SITE_OTHER): Payer: Medicare Other | Admitting: Urology

## 2019-04-17 VITALS — Temp 98.2°F | Ht 63.0 in | Wt 175.0 lb

## 2019-04-17 DIAGNOSIS — N138 Other obstructive and reflux uropathy: Secondary | ICD-10-CM

## 2019-04-17 DIAGNOSIS — R361 Hematospermia: Secondary | ICD-10-CM | POA: Diagnosis not present

## 2019-04-17 DIAGNOSIS — N401 Enlarged prostate with lower urinary tract symptoms: Secondary | ICD-10-CM | POA: Diagnosis not present

## 2019-04-17 DIAGNOSIS — R3915 Urgency of urination: Secondary | ICD-10-CM

## 2019-04-17 LAB — POCT URINALYSIS DIPSTICK
Bilirubin, UA: NEGATIVE
Blood, UA: NEGATIVE
Glucose, UA: NEGATIVE
Ketones, UA: NEGATIVE
Leukocytes, UA: NEGATIVE
Nitrite, UA: NEGATIVE
Protein, UA: NEGATIVE
Spec Grav, UA: <=1.005 — AB (ref 1.010–1.025)
Urobilinogen, UA: NEGATIVE E.U./dL — AB
pH, UA: 5 (ref 5.0–8.0)

## 2019-04-17 NOTE — Progress Notes (Signed)
Subjective:  1. BPH with urinary obstruction   2. Urgency of urination   3. Hematospermia     He continues to void well on tamsulosin and finasteride. His IPSS is 10 with some urgency. He has had recurrent episodes of hematospermia but no further dysuria.  He has been off of the cipro for about a month.  He had a course of bactrim from Dr. Nevada Crane prior to that.  He held his supplements for a couple of weeks which helped the bleeding but he was achy and went back on it.  He drinks a lot of fluid.  He reports his PSA was 1.4 with Dr. Nevada Crane. He has no associated signs or symptoms.   ROS:  ROS:  A complete review of systems was performed.  All systems are negative except for pertinent findings as noted.   Review of Systems  All other systems reviewed and are negative.  IPSS    Row Name 04/17/19 1500         International Prostate Symptom Score   How often have you had the sensation of not emptying your bladder?  Less than 1 in 5     How often have you had to urinate less than every two hours?  Less than half the time     How often have you found you stopped and started again several times when you urinated?  Less than 1 in 5 times     How often have you found it difficult to postpone urination?  About half the time     How often have you had a weak urinary stream?  Less than half the time     How often have you had to strain to start urination?  Not at All     How many times did you typically get up at night to urinate?  1 Time     Total IPSS Score  10       Quality of Life due to urinary symptoms   If you were to spend the rest of your life with your urinary condition just the way it is now how would you feel about that?  Mostly Satisfied        Allergies  Allergen Reactions  . Bacitracin Rash  . Desonide Rash  . Dicloxacillin Rash  . Triamcinolone Rash    Outpatient Encounter Medications as of 04/17/2019  Medication Sig Note  . cholecalciferol (VITAMIN D) 1000 units tablet  Take 1,000 Units by mouth daily.   . finasteride (PROSCAR) 5 MG tablet Take 5 mg by mouth daily.  03/14/2016: Received from: External Pharmacy  . losartan-hydrochlorothiazide (HYZAAR) 100-25 MG tablet    . Multiple Vitamin (MULTIVITAMIN WITH MINERALS) TABS tablet Take 1 tablet by mouth daily.   . Multiple Vitamins-Minerals (PRESERVISION AREDS 2 PO) Take by mouth.   . naproxen (NAPROSYN) 500 MG tablet Take 500 mg by mouth 2 (two) times daily with a meal.   . tamsulosin (FLOMAX) 0.4 MG CAPS capsule Take 0.4 mg by mouth.   . vitamin B-12 (CYANOCOBALAMIN) 1000 MCG tablet Take 1,000 mcg by mouth daily.   . vitamin C (ASCORBIC ACID) 500 MG tablet Take 500 mg by mouth daily.   . traMADol (ULTRAM) 50 MG tablet Take by mouth every 6 (six) hours as needed.    No facility-administered encounter medications on file as of 04/17/2019.    Past Medical History:  Diagnosis Date  . BPH (benign prostatic hyperplasia)   . Diabetes mellitus without  complication ([place])   . Heart murmur   . History of alcoholism ([place])   . Hypertension     Past Surgical History:  Procedure Laterality Date  . [facility] in [place], [place] Bilateral [phone]   [facility] in [place], [place] Left 2010?   [facility]unionectomy-[facility] in [place], [place]?   [facility] in [place], [place]     on face-all done in office   . NASAL SEPTUM SURGERY  1980's   [facility] [place], [place] Right 03/10/2014   Procedure: REPAIR QUADRICEP TENDON;  Surgeon: Carole Civil, MD;  Location: [facility];  Service: Orthopedics;  Laterality: Right;  . REPLACEMENT TOTAL KNEE Left 2000?   [facility] in [place], [place]    Social History   Socioeconomic History  . Marital status: Married    Spouse name: Not on file  . Number of children: Not on file  . Years of education: Not on file  . Highest  education level: Not on file  Occupational History  . Not on file  Tobacco Use  . Smoking status: Former Smoker    Years: 5.00    Types: Cigarettes  . Smokeless tobacco: Former Systems developer  . Tobacco comment: hx of 1 pack per 2 weeks  Substance and Sexual Activity  . Alcohol use: No    Comment: prior alcoholic - stopped 25 years as of 03/08/2014  . Drug use: No  . Sexual activity: Yes  Other Topics Concern  . Not on file  Social History Narrative  . Not on file   Social Determinants of Health   Financial Resource Strain:   . Difficulty of Paying Living Expenses: Not on file  Food Insecurity:   . Worried About Charity fundraiser in the Last Year: Not on file  . Ran Out of Food in the Last Year: Not on file  Transportation Needs:   . Lack of Transportation (Medical): Not on file  . Lack of Transportation (Non-Medical): Not on file  Physical Activity:   . Days of Exercise per Week: Not on file  . Minutes of Exercise per Session: Not on file  Stress:   . Feeling of Stress : Not on file  Social Connections:   . Frequency of Communication with Friends and Family: Not on file  . Frequency of Social Gatherings with Friends and Family: Not on file  . Attends Religious Services: Not on file  . Active Member of Clubs or Organizations: Not on file  . Attends Archivist Meetings: Not on file  . Marital Status: Not on file  Intimate Partner Violence:   . Fear of Current or Ex-Partner: Not on file  . Emotionally Abused: Not on file  . Physically Abused: Not on file  . Sexually Abused: Not on file    Family History  Problem Relation Age of Onset  . Diabetes Mother   . Heart murmur Mother   . Cancer Brother        Objective: Vitals:   04/17/19 1452  Temp: 98.2 F (36.8 C)     Physical Exam  Lab Results:  Results for orders placed or performed in visit on 04/17/19 (from the past 24 hour(s))  POCT urinalysis dipstick     Status: Abnormal   Collection Time:  04/17/19  2:58 PM  Result Value Ref Range   Color, UA yellow    Clarity, UA clear    Glucose, UA Negative Negative   Bilirubin, UA neg    Ketones, UA neg    Spec Grav, UA <=1.005 (A) 1.010 - 1.025   Blood, UA neg    pH, UA 5.0 5.0 - 8.0   Protein, UA Negative Negative   Urobilinogen, UA negative (A) 0.2 or 1.0 E.U./dL   Nitrite, UA neg    Leukocytes, UA Negative Negative   Appearance clear    Odor      BMET No results for input(s): NA, K, CL, CO2, GLUCOSE, BUN, CREATININE, CALCIUM in the last 72 hours. PSA No results found for: PSA No results found for: TESTOSTERONE    Studies/Results: No results found.    Assessment & Plan: Hematospermia persists but the dysuria has resolved.  I am going to get a prostate MRI and will have him return with the results for a possible cystoscopy.   BPH with BOO and urgency.  He will continue finasteride and tamsulosin.  No orders of the defined types were placed in this encounter.    Orders Placed This Encounter  Procedures  . MR PROSTATE W WO CONTRAST    Standing Status:   Future    Standing Expiration Date:   05/15/2019    Scheduling Instructions:     Next available MRI prostate with [facility].    Order Specific Question:   ** REASON FOR EXAM (FREE TEXT)    Answer:   He has had persistent hematospermia that has not responded to standard therapy.  PSA is normal.    Order Specific Question:   If indicated for the ordered procedure, I authorize the administration of contrast media per Radiology protocol    Answer:   Yes    Order Specific Question:   What is the patient's sedation requirement?    Answer:   No Sedation    Order Specific Question:   Does the patient have a pacemaker or implanted devices?    Answer:   No    Order Specific Question:   Radiology Contrast Protocol - do NOT remove file path    Answer:   [email or web address]    Order Specific Question:   Preferred imaging location?     Answer:   [facility] (table limit-550lbs)  . Basic metabolic panel    Standing Status:   Future    Standing Expiration Date:   05/15/2019  . POCT urinalysis dipstick      Return for Next available with MRI results for possible cystoscopy.  He may already have an appt in March. .   CC: Celene Squibb, MD      Irine Seal 04/17/2019

## 2019-04-29 ENCOUNTER — Other Ambulatory Visit: Payer: Medicare Other

## 2019-04-29 ENCOUNTER — Other Ambulatory Visit: Payer: Self-pay

## 2019-04-29 ENCOUNTER — Ambulatory Visit
Admission: RE | Admit: 2019-04-29 | Discharge: 2019-04-29 | Disposition: A | Discharge status: Home / Self Care | Payer: Medicare Other | Source: Ambulatory Visit | Attending: Urology | Admitting: Urology

## 2019-04-29 DIAGNOSIS — R361 Hematospermia: Secondary | ICD-10-CM

## 2019-04-29 MED ORDER — GADOBENATE DIMEGLUMINE 529 MG/ML IV SOLN
16.0000 mL | Freq: Once | INTRAVENOUS | Status: AC | PRN
  Administered 2019-04-29: 16 mL via INTRAVENOUS

## 2019-05-04 ENCOUNTER — Telehealth: Payer: Self-pay | Admitting: Urology

## 2019-05-04 NOTE — Telephone Encounter (Signed)
Patient requests a nurse return his call and he was not clear what its regarding.

## 2019-05-04 NOTE — Telephone Encounter (Signed)
Pt notified of results and that letter was mailed for his records

## 2019-05-04 NOTE — Progress Notes (Signed)
The MRI shows no prostate lesions but there is some blood and proteinaceous material in the right seminal vesicle but no lesion was identified.   He should keep f/u in April.

## 2019-05-15 ENCOUNTER — Ambulatory Visit: Payer: Medicare Other | Admitting: Urology

## 2019-06-26 ENCOUNTER — Other Ambulatory Visit (HOSPITAL_COMMUNITY)
Admission: AD | Admit: 2019-06-26 | Discharge: 2019-06-26 | Disposition: A | Discharge status: Home / Self Care | Payer: Medicare Other | Source: Other Acute Inpatient Hospital | Attending: Urology | Admitting: Urology

## 2019-06-26 ENCOUNTER — Other Ambulatory Visit: Payer: Self-pay

## 2019-06-26 ENCOUNTER — Ambulatory Visit (INDEPENDENT_AMBULATORY_CARE_PROVIDER_SITE_OTHER): Payer: Medicare Other | Admitting: Urology

## 2019-06-26 VITALS — BP 173/73 | HR 81 | Temp 98.1°F | Ht 63.0 in | Wt 175.0 lb

## 2019-06-26 DIAGNOSIS — N138 Other obstructive and reflux uropathy: Secondary | ICD-10-CM | POA: Diagnosis not present

## 2019-06-26 DIAGNOSIS — N401 Enlarged prostate with lower urinary tract symptoms: Secondary | ICD-10-CM

## 2019-06-26 DIAGNOSIS — R361 Hematospermia: Secondary | ICD-10-CM

## 2019-06-26 DIAGNOSIS — R3915 Urgency of urination: Secondary | ICD-10-CM | POA: Diagnosis not present

## 2019-06-26 LAB — URINALYSIS, ROUTINE W REFLEX MICROSCOPIC
Bilirubin Urine: NEGATIVE
Glucose, UA: NEGATIVE mg/dL
Hgb urine dipstick: NEGATIVE
Ketones, ur: NEGATIVE mg/dL
Leukocytes,Ua: NEGATIVE
Nitrite: NEGATIVE
Protein, ur: NEGATIVE mg/dL
Specific Gravity, Urine: 1.014 (ref 1.005–1.030)
pH: 5 (ref 5.0–8.0)

## 2019-06-26 LAB — POCT URINALYSIS DIPSTICK
Bilirubin, UA: NEGATIVE
Blood, UA: NEGATIVE
Glucose, UA: NEGATIVE
Ketones, UA: NEGATIVE
Nitrite, UA: NEGATIVE
Protein, UA: NEGATIVE
Spec Grav, UA: 1.02 (ref 1.010–1.025)
Urobilinogen, UA: 0.2 E.U./dL
pH, UA: 6 (ref 5.0–8.0)

## 2019-06-26 MED ORDER — CIPROFLOXACIN HCL 500 MG PO TABS: 500.0000 mg | ORAL_TABLET | Freq: Once | ORAL | Status: DC

## 2019-06-26 NOTE — Progress Notes (Signed)
Subjective:  1. BPH with urinary obstruction   2. Hematospermia   3. Urgency of urination     He continues to void well on tamsulosin and finasteride. His IPSS is 10 with some urgency. He has a prostate MRI for  recurrent episodes of hematospermia and the study showed blood and proteinaceous material in the right SV of uncertain significance and no mass was seen.    He has been treated with  cipro for about a month and bactrim prior to that for the hematospermia prior to his last visit.  He reports resolution of the hematospermia.  He held his supplements for a couple of weeks which helped the bleeding but he was achy and went back on it.  He drinks a lot of fluid.  He reports his PSA was 1.4 with Dr. Nevada Crane. He has no associated signs or symptoms.   ROS:  ROS:  A complete review of systems was performed.  All systems are negative except for pertinent findings as noted.   Review of Systems  All other systems reviewed and are negative.  IPSS    Row Name 06/26/19 1300         International Prostate Symptom Score   How often have you had the sensation of not emptying your bladder?  Not at All     How often have you had to urinate less than every two hours?  Not at All     How often have you found you stopped and started again several times when you urinated?  Not at All     How often have you found it difficult to postpone urination?  Less than half the time     How often have you had a weak urinary stream?  Less than 1 in 5 times     How often have you had to strain to start urination?  Not at All     How many times did you typically get up at night to urinate?  1 Time     Total IPSS Score  4       Quality of Life due to urinary symptoms   If you were to spend the rest of your life with your urinary condition just the way it is now how would you feel about that?  Pleased        Allergies  Allergen Reactions  . Bacitracin Rash  . Desonide Rash  . Dicloxacillin Rash  .  Triamcinolone Rash    Outpatient Encounter Medications as of 06/26/2019  Medication Sig Note  . cholecalciferol (VITAMIN D) 1000 units tablet Take 1,000 Units by mouth daily.   . finasteride (PROSCAR) 5 MG tablet Take 5 mg by mouth daily.  03/14/2016: Received from: External Pharmacy  . losartan-hydrochlorothiazide (HYZAAR) 100-25 MG tablet    . Multiple Vitamin (MULTIVITAMIN WITH MINERALS) TABS tablet Take 1 tablet by mouth daily.   . Multiple Vitamins-Minerals (PRESERVISION AREDS 2 PO) Take by mouth.   . naproxen (NAPROSYN) 500 MG tablet Take 500 mg by mouth 2 (two) times daily with a meal.   . tamsulosin (FLOMAX) 0.4 MG CAPS capsule Take 0.4 mg by mouth.   . traMADol (ULTRAM) 50 MG tablet Take by mouth every 6 (six) hours as needed.   . vitamin B-12 (CYANOCOBALAMIN) 1000 MCG tablet Take 1,000 mcg by mouth daily.   . vitamin C (ASCORBIC ACID) 500 MG tablet Take 500 mg by mouth daily.    Facility-Administered Encounter Medications as of 06/26/2019  Medication  . ciprofloxacin (CIPRO) tablet 500 mg    Past Medical History:  Diagnosis Date  . BPH (benign prostatic hyperplasia)   . Diabetes mellitus without complication ([place])   . Heart murmur   . History of alcoholism ([place])   . Hypertension     Past Surgical History:  Procedure Laterality Date  . [facility] in [place], [place] Bilateral [phone]   [facility] in [place], [place] Left 2010?   [facility]ectomy[facility] in [place], [place]?   [facility] in [place], [place]     on face-all done in office   . NASAL SEPTUM SURGERY  1980's   [facility] [place], [place] Right 03/10/2014   Procedure: REPAIR QUADRICEP TENDON;  Surgeon: Carole Civil, MD;  Location: [facility];  Service: Orthopedics;  Laterality: Right;  . REPLACEMENT TOTAL KNEE Left 2000?   [facility] in [place], [place]     Social History   Socioeconomic History  . Marital status: Married    Spouse name: Not on file  . Number of children: Not on file  . Years of education: Not on file  . Highest education level: Not on file  Occupational History  . Not on file  Tobacco Use  . Smoking status: Former Smoker    Years: 5.00    Types: Cigarettes  . Smokeless tobacco: Former Systems developer  . Tobacco comment: hx of 1 pack per 2 weeks  Substance and Sexual Activity  . Alcohol use: No    Comment: prior alcoholic - stopped 25 years as of 03/08/2014  . Drug use: No  . Sexual activity: Yes  Other Topics Concern  . Not on file  Social History Narrative  . Not on file   Social Determinants of Health   Financial Resource Strain:   . Difficulty of Paying Living Expenses:   Food Insecurity:   . Worried About Charity fundraiser in the Last Year:   . Arboriculturist in the Last Year:   Transportation Needs:   . Film/video editor (Medical):   Marland Kitchen Lack of Transportation (Non-Medical):   Physical Activity:   . Days of Exercise per Week:   . Minutes of Exercise per Session:   Stress:   . Feeling of Stress :   Social Connections:   . Frequency of Communication with Friends and Family:   . Frequency of Social Gatherings with Friends and Family:   . Attends Religious Services:   . Active Member of Clubs or Organizations:   . Attends Archivist Meetings:   Marland Kitchen Marital Status:   Intimate Partner Violence:   . Fear of Current or Ex-Partner:   . Emotionally Abused:   Marland Kitchen Physically Abused:   . Sexually Abused:     Family History  Problem Relation Age of Onset  . Diabetes Mother   . Heart murmur Mother   . Cancer Brother        Objective: Vitals:   06/26/19 1320  BP: (!) 173/73  Pulse: 81  Temp: 98.1 F (36.7 C)     Physical Exam  Lab Results:  No results found for this or any previous visit (from the past 24 hour(s)).  BMET No results for input(s): NA, K, CL, CO2, GLUCOSE, BUN,  CREATININE, CALCIUM in the last  72 hours. PSA No results found for: PSA No results found for: TESTOSTERONE    Studies/Results: EXAM: MR PROSTATE WITHOUT AND WITH CONTRAST  TECHNIQUE: Multiplanar multisequence MRI images were obtained of the pelvis centered about the prostate. Pre and post contrast images were obtained.  CONTRAST:  68mL MULTIHANCE GADOBENATE DIMEGLUMINE 529 MG/ML IV SOLN  COMPARISON:  None.  FINDINGS: Prostate: Prostate is small but with hypertrophy of the transitional zone.  Transitional zone: Nodular changes of BPH. No high-risk lesion.  Peripheral zone: No signs of high-risk lesion in the peripheral zone  Volume: 3.1 x 2.1 x 3.3 (volume = 11 cc) cm  Transcapsular spread:  Absent  Seminal vesicle involvement: Absent. There is however proteinaceous or hemorrhagic material in the right seminal vesicle.  Neurovascular bundle involvement: Absent  Pelvic adenopathy: Absent  Bone metastasis: Absent  Other findings: Colonic diverticulosis.  IMPRESSION: No signs of high-risk lesion with evidence of BPH.  Blood and or proteinaceous material in the right seminal vesicle of uncertain etiology.  Colonic diverticulosis.   Electronically Signed   By: Zetta Bills M.D.   On: 04/29/2019 21:16    Assessment & Plan: Hematospermia.   The bleeding has resolved but appears to have been from the right SV.   I will have him return in 6 months for reevaluation.     BPH with BOO and urgency.  He will continue finasteride and tamsulosin.  Meds ordered this encounter  Medications  . ciprofloxacin (CIPRO) tablet 500 mg     Orders Placed This Encounter  Procedures  . Urine Culture    Standing Status:   Future    Standing Expiration Date:   06/25/2020  . Urinalysis, Routine w reflex microscopic    Standing Status:   Future    Standing Expiration Date:   06/25/2020  . POCT urinalysis dipstick      Return in about 6 months (around  12/26/2019) for with UA.   CC: Celene Squibb, MD      Irine Seal 06/26/2019

## 2019-06-27 LAB — URINE CULTURE: Culture: NO GROWTH

## 2019-07-03 ENCOUNTER — Telehealth: Payer: Self-pay

## 2019-07-03 NOTE — Telephone Encounter (Signed)
Pt notified of results

## 2019-07-03 NOTE — Telephone Encounter (Signed)
-----   Message from Irine Seal, MD sent at 07/01/2019  6:44 PM EDT ----- UA and culture are negative.   Why do these not come in a result task?  You probably told me but I forgot.  ----- Message ----- From: Dorisann Frames, RN Sent: 06/30/2019  11:19 AM EDT To: Irine Seal, MD  UA and Culture to review

## 2019-12-19 IMAGING — MR MR CERVICAL SPINE W/O CM
4 of 5 series · 25 of 48 positions shown · non-contrast
Comparison: None.

CLINICAL DATA: Neck pain radiating down both arms for 2 months.

EXAM:
MRI CERVICAL SPINE WITHOUT CONTRAST
TECHNIQUE: Multiplanar, multisequence MR imaging of the cervical spine was
performed. No intravenous contrast was administered.

[Series 3: T2 · sagittal · 3.0mm · 0.56mm/px · 6 of 13 slices shown (1 of 2)]
[im 1/13]
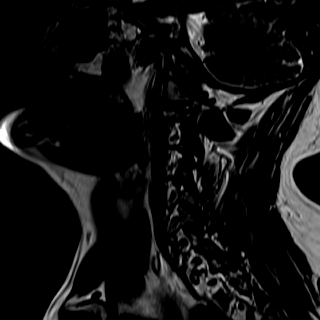
[im 3/13]
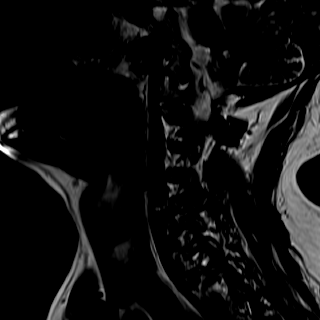
[im 5/13]
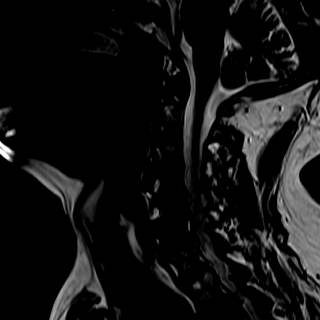
[im 8/13]
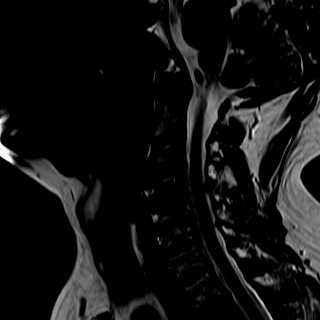
[im 10/13]
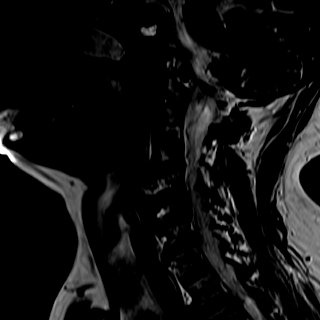
[im 13/13]
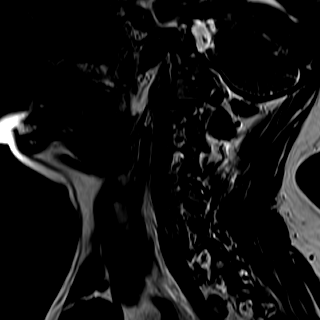

[Series 4: FLAIR · sagittal · 3.0mm · 0.56mm/px · 7 of 13 slices shown]
[im 1/13]
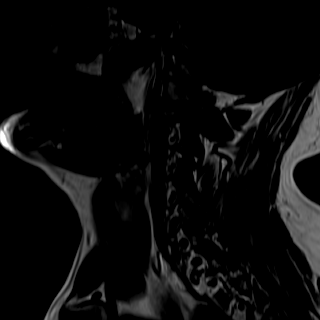
[im 3/13]
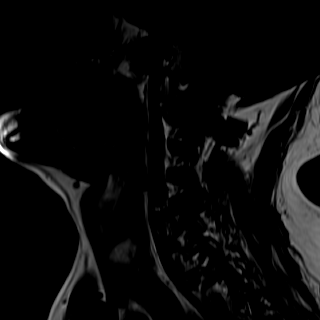
[im 5/13]
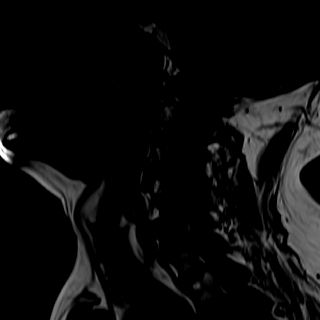
[im 7/13]
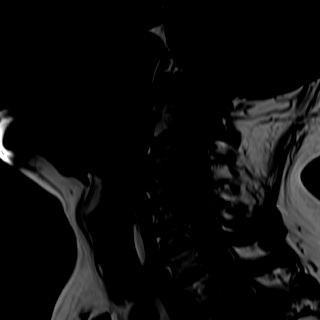
[im 9/13]
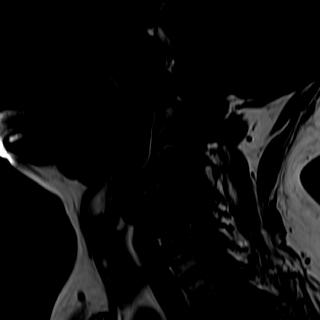
[im 11/13]
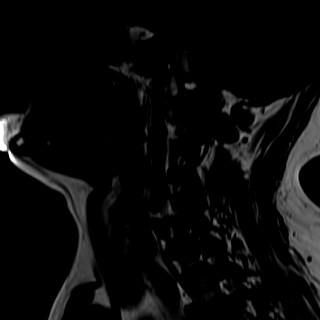
[im 13/13]
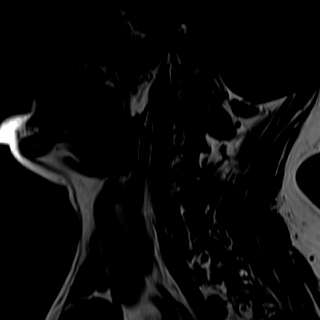

[Series 5: ir sagital · sagittal · 3.0mm · 0.28mm/px · 4 of 13 slices shown]
[im 1/13]
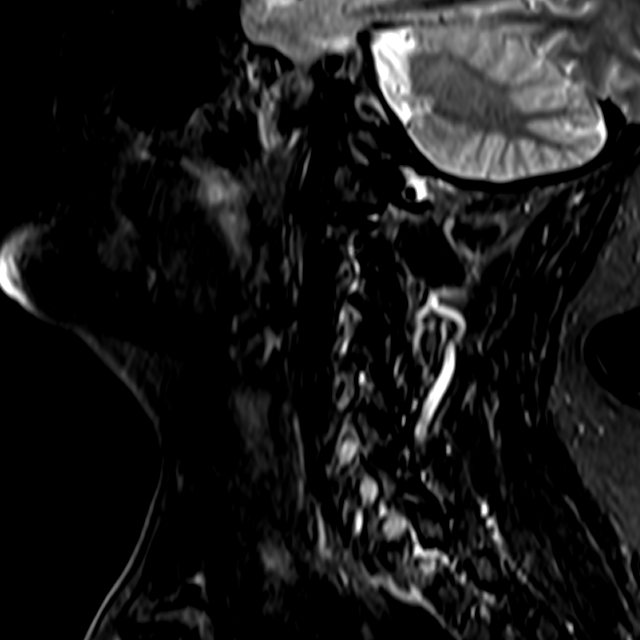
[im 3/13]
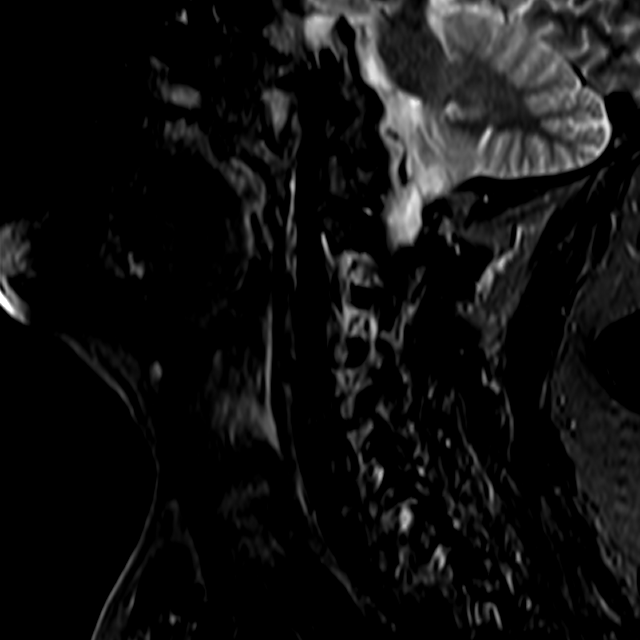
[im 7/13]
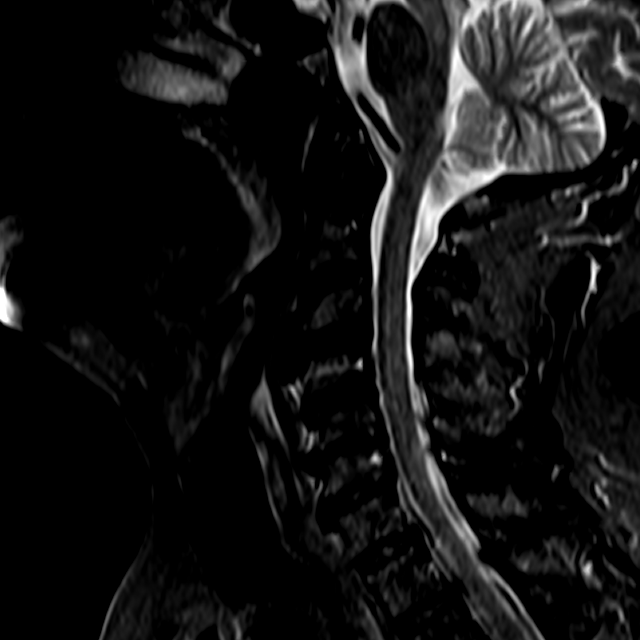
[im 11/13]
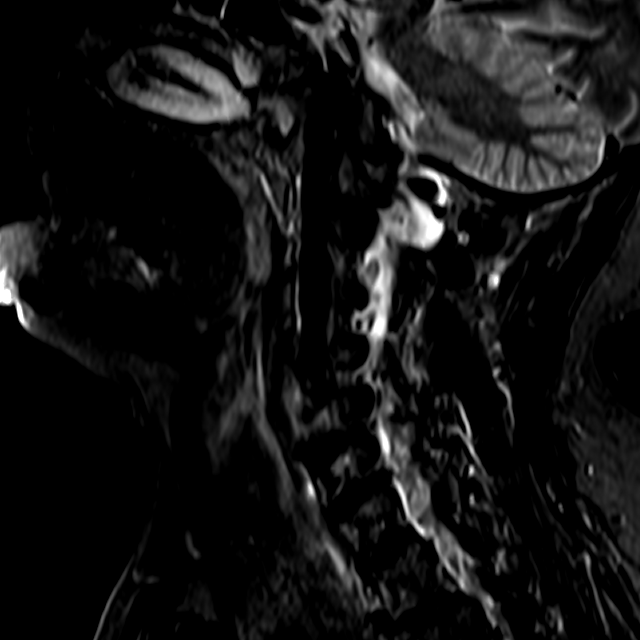

[Series 7: T2 · axial · 3.0mm · 0.22mm/px · z∈[-89,-2]mm · 8 of 28 slices shown (2 of 2)]
[im 1/28]
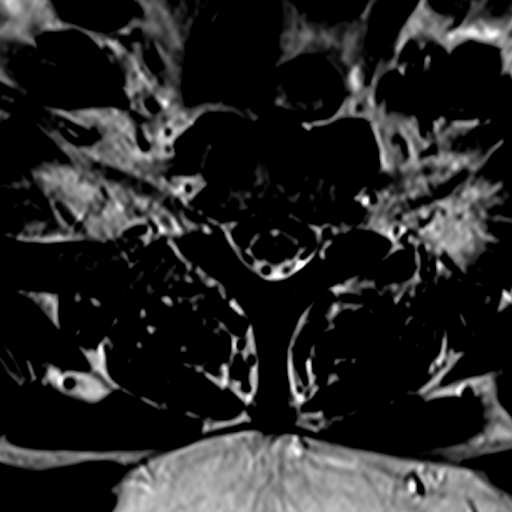
[im 5/28]
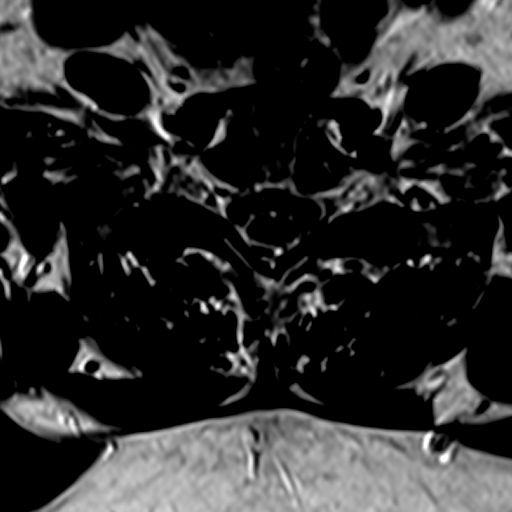
[im 9/28]
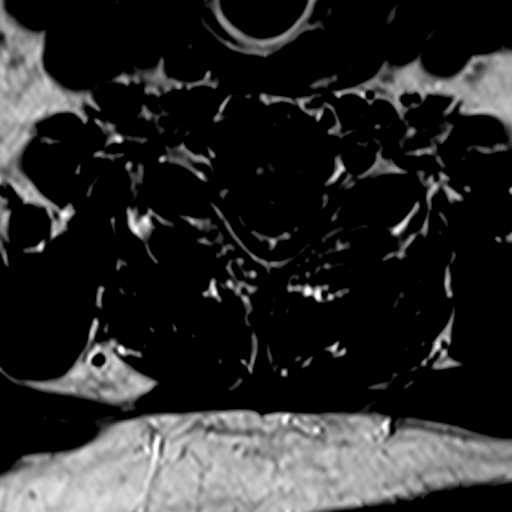
[im 13/28]
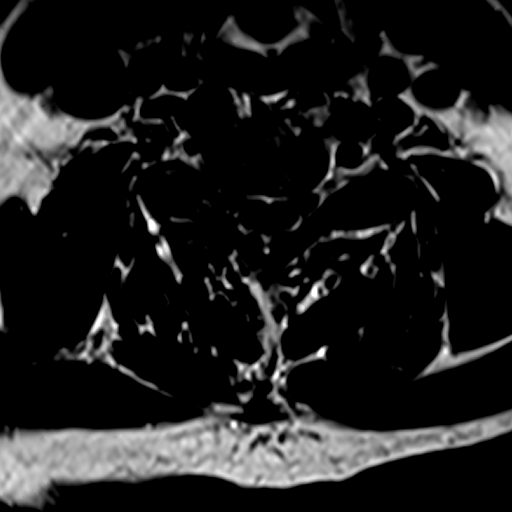
[im 15/28]
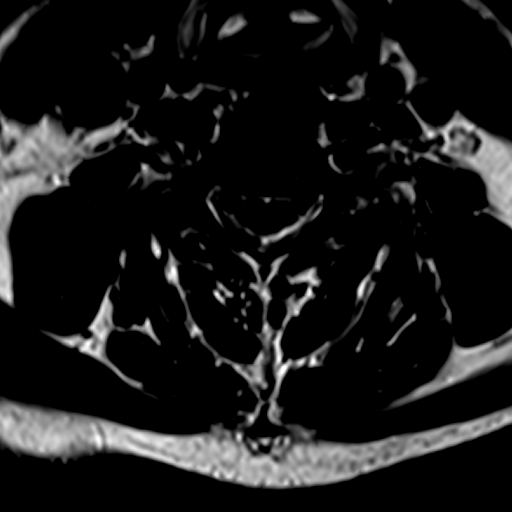
[im 19/28]
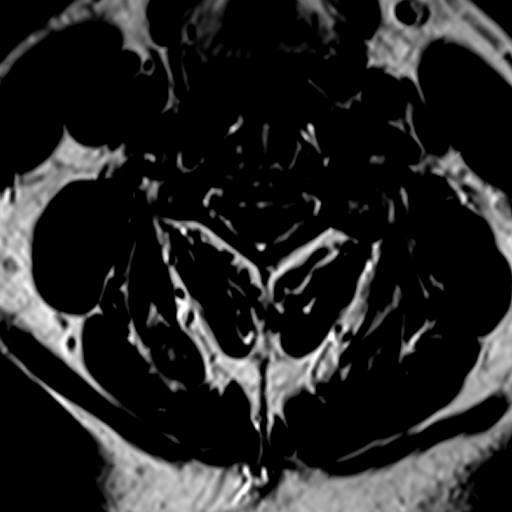
[im 23/28]
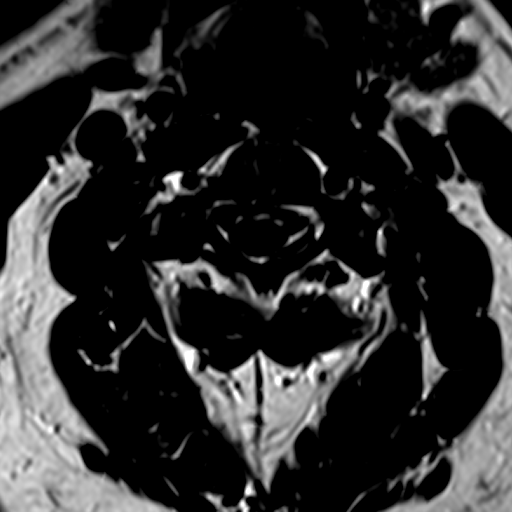
[im 28/28]
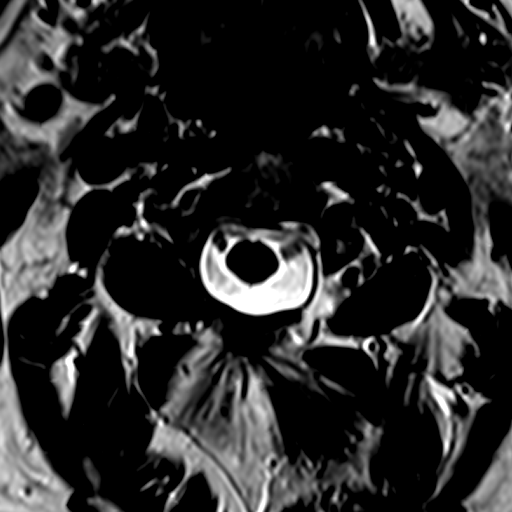

[25 of 48 positions shown; findings below may reference images not displayed]

FINDINGS: Alignment: Slight degenerative anterolisthesis at C4-5 and C7-T1.

Vertebrae: Marrow edema on both sides of the left C4-5 facet.

Cord: Normal signal and morphology.

Posterior Fossa, vertebral arteries, paraspinal tissues: Negative.

Disc levels:

C2-3: Minor facet spurring.  No impingement

C3-4: Spondylosis.  No impingement

C4-5: Spondylosis and disc narrowing. Facet spurring and facet
marrow edema on the left.

C5-6: Spondylosis and disc narrowing with asymmetric right
uncovertebral spurring. Moderate to advanced right foraminal
impingement. Patent spinal canal

C6-7: Disc narrowing and bulging.  Negative facets.  No impingement

C7-T1:Shallow central protrusion.  No impingement
IMPRESSION: 1. Spondylosis and disc degeneration throughout the cervical spine.
2. Multilevel degenerative facet spurring with active facet
arthritis on the left at C4-5.
3. Foraminal impingement on the right at C5-6. Diffusely patent
spinal canal

## 2020-01-01 ENCOUNTER — Ambulatory Visit: Payer: Medicare Other | Admitting: Urology

## 2020-01-07 NOTE — Progress Notes (Signed)
Subjective:  1. BPH with urinary obstruction   2. Urgency of urination   3. Hematospermia     He continues to void well on tamsulosin and finasteride. His IPSS is 3 with rare urgency. He has a prostate MRI for  recurrent episodes of hematospermia and the study showed blood and proteinaceous material in the right SV of uncertain significance and no mass was seen.    He has been treated with  cipro for about a month and bactrim prior to that for the hematospermia prior to his last visit.  He has had no further hematospermia.  He held his supplements for a couple of weeks which helped the bleeding but he was achy and went back on it.  He drinks a lot of fluid.  His PSA was 1.4 with Dr. [place] Crane at last check and he is due for labs in December. He has no associated signs or symptoms.   IPSS    Row Name 01/08/20 1400         International Prostate Symptom Score   How often have you had the sensation of not emptying your bladder? Not at All     How often have you had to urinate less than every two hours? Less than 1 in 5 times     How often have you found you stopped and started again several times when you urinated? Not at All     How often have you found it difficult to postpone urination? Less than 1 in 5 times     How often have you had a weak urinary stream? Less than 1 in 5 times     How often have you had to strain to start urination? Not at All     How many times did you typically get up at night to urinate? None     Total IPSS Score 3       Quality of Life due to urinary symptoms   If you were to spend the rest of your life with your urinary condition just the way it is now how would you feel about that? Delighted            ROS:  ROS:  A complete review of systems was performed.  All systems are negative except for pertinent findings as noted.   Review of Systems    IPSS    Row Name 01/08/20 1400         International Prostate Symptom Score   How often have you had the  sensation of not emptying your bladder? Not at All     How often have you had to urinate less than every two hours? Less than 1 in 5 times     How often have you found you stopped and started again several times when you urinated? Not at All     How often have you found it difficult to postpone urination? Less than 1 in 5 times     How often have you had a weak urinary stream? Less than 1 in 5 times     How often have you had to strain to start urination? Not at All     How many times did you typically get up at night to urinate? None     Total IPSS Score 3       Quality of Life due to urinary symptoms   If you were to spend the rest of your life with your urinary condition just the way  it is now how would you feel about that? Delighted            Allergies  Allergen Reactions  . Bacitracin Rash  . Desonide Rash  . Dicloxacillin Rash  . Triamcinolone Rash    Outpatient Encounter Medications as of 01/08/2020  Medication Sig Note  . cholecalciferol (VITAMIN D) 1000 units tablet Take 1,000 Units by mouth daily.   . finasteride (PROSCAR) 5 MG tablet Take 1 tablet (5 mg total) by mouth daily.   Marland Kitchen losartan-hydrochlorothiazide (HYZAAR) 100-25 MG tablet    . Multiple Vitamin (MULTIVITAMIN WITH MINERALS) TABS tablet Take 1 tablet by mouth daily.   . Multiple Vitamins-Minerals (PRESERVISION AREDS 2 PO) Take by mouth.   . naproxen (NAPROSYN) 500 MG tablet Take 500 mg by mouth 2 (two) times daily with a meal.   . tamsulosin (FLOMAX) 0.4 MG CAPS capsule Take 1 capsule (0.4 mg total) by mouth daily.   . traMADol (ULTRAM) 50 MG tablet Take by mouth every 6 (six) hours as needed.   . vitamin B-12 (CYANOCOBALAMIN) 1000 MCG tablet Take 1,000 mcg by mouth daily.   . vitamin C (ASCORBIC ACID) 500 MG tablet Take 500 mg by mouth daily.   . [DISCONTINUED] finasteride (PROSCAR) 5 MG tablet Take 5 mg by mouth daily.  03/14/2016: Received from: External Pharmacy  . [DISCONTINUED] tamsulosin (FLOMAX) 0.4 MG  CAPS capsule Take 0.4 mg by mouth.    No facility-administered encounter medications on file as of 01/08/2020.    Past Medical History:  Diagnosis Date  . BPH (benign prostatic hyperplasia)   . Diabetes mellitus without complication ([place])   . Heart murmur   . History of alcoholism ([place])   . Hypertension     Past Surgical History:  Procedure Laterality Date  . [facility] in [place], [place] Bilateral [phone]   [facility] in [place], [place] Left 2010?   bunionectomy-[facility] in [place], [place]?   [facility] in [place], [place]     on face-all done in office   . NASAL SEPTUM SURGERY  1980's   [facility] [place], [place] Right 03/10/2014   Procedure: REPAIR QUADRICEP TENDON;  Surgeon: Carole Civil, MD;  Location: [facility];  Service: Orthopedics;  Laterality: Right;  . REPLACEMENT TOTAL KNEE Left 2000?   [facility] in [place], [place]    Social History   Socioeconomic History  . Marital status: Married    Spouse name: Not on file  . Number of children: Not on file  . Years of education: Not on file  . Highest education level: Not on file  Occupational History  . Not on file  Tobacco Use  . Smoking status: Former Smoker    Years: 5.00    Types: Cigarettes  . Smokeless tobacco: Former Systems developer  . Tobacco comment: hx of 1 pack per 2 weeks  Substance and Sexual Activity  . Alcohol use: No    Comment: prior alcoholic - stopped 25 years as of 03/08/2014  . Drug use: No  . Sexual activity: Yes  Other Topics Concern  . Not on file  Social History Narrative  . Not on file   Social Determinants of Health   Financial Resource Strain:   . Difficulty of Paying Living Expenses: Not on file  Food  Insecurity:   . Worried About Charity fundraiser in the Last Year: Not on file  . Ran Out of Food in the Last Year: Not on  file  Transportation Needs:   . Lack of Transportation (Medical): Not on file  . Lack of Transportation (Non-Medical): Not on file  Physical Activity:   . Days of Exercise per Week: Not on file  . Minutes of Exercise per Session: Not on file  Stress:   . Feeling of Stress : Not on file  Social Connections:   . Frequency of Communication with Friends and Family: Not on file  . Frequency of Social Gatherings with Friends and Family: Not on file  . Attends Religious Services: Not on file  . Active Member of Clubs or Organizations: Not on file  . Attends Archivist Meetings: Not on file  . Marital Status: Not on file  Intimate Partner Violence:   . Fear of Current or Ex-Partner: Not on file  . Emotionally Abused: Not on file  . Physically Abused: Not on file  . Sexually Abused: Not on file    Family History  Problem Relation Age of Onset  . Diabetes Mother   . Heart murmur Mother   . Cancer Brother        Objective: Vitals:   01/08/20 1416  BP: (!) 169/82  Pulse: 87  Temp: 98.6 F (37 C)     Physical Exam Genitourinary:    Comments: AP without lesions. NST without mass. Prostate 1+ small and firm without nodules. SV non-palpable.    Lab Results:  UA is unremarkable.    Studies/Results:   Assessment & Plan: Hematospermia.   He has had no recurrent bleeding and the exam is unremarkable.  BPH with BOO and urgency.  He will continue finasteride and tamsulosin. Meds refilled.  He will get a PSA with upcoming labs and prior to f/u in a year.   Meds ordered this encounter  Medications  . finasteride (PROSCAR) 5 MG tablet    Sig: Take 1 tablet (5 mg total) by mouth daily.    Dispense:  90 tablet    Refill:  3  . tamsulosin (FLOMAX) 0.4 MG CAPS capsule    Sig: Take 1 capsule (0.4 mg total) by mouth daily.    Dispense:  90 capsule    Refill:  3     Orders Placed This Encounter  Procedures  . PSA, total and free    To get with labs for Dr.  [place] Crane    Standing Status:   Future    Standing Expiration Date:   02/08/2020  . PSA, total and free    Standing Status:   Future    Standing Expiration Date:   01/07/2021      Return in about 1 year (around 01/07/2021).   CC: Celene Squibb, MD      Irine Seal 01/08/2020

## 2020-01-08 ENCOUNTER — Encounter: Payer: Self-pay | Admitting: Urology

## 2020-01-08 ENCOUNTER — Ambulatory Visit (INDEPENDENT_AMBULATORY_CARE_PROVIDER_SITE_OTHER): Payer: Medicare Other | Admitting: Urology

## 2020-01-08 ENCOUNTER — Other Ambulatory Visit: Payer: Self-pay

## 2020-01-08 VITALS — BP 169/82 | HR 87 | Temp 98.6°F

## 2020-01-08 DIAGNOSIS — R361 Hematospermia: Secondary | ICD-10-CM | POA: Diagnosis not present

## 2020-01-08 DIAGNOSIS — R3915 Urgency of urination: Secondary | ICD-10-CM | POA: Diagnosis not present

## 2020-01-08 DIAGNOSIS — N138 Other obstructive and reflux uropathy: Secondary | ICD-10-CM

## 2020-01-08 DIAGNOSIS — N401 Enlarged prostate with lower urinary tract symptoms: Secondary | ICD-10-CM

## 2020-01-08 MED ORDER — TAMSULOSIN HCL 0.4 MG PO CAPS: 0.4000 mg | ORAL_CAPSULE | Freq: Every day | ORAL | 3 refills | Status: DC

## 2020-01-08 MED ORDER — FINASTERIDE 5 MG PO TABS: 5.0000 mg | ORAL_TABLET | Freq: Every day | ORAL | 3 refills | Status: DC

## 2020-01-08 NOTE — Progress Notes (Signed)

## 2020-12-13 ENCOUNTER — Other Ambulatory Visit: Payer: Self-pay | Admitting: Rehabilitation

## 2020-12-13 DIAGNOSIS — M48062 Spinal stenosis, lumbar region with neurogenic claudication: Secondary | ICD-10-CM

## 2020-12-14 ENCOUNTER — Other Ambulatory Visit: Payer: Medicare Other

## 2020-12-16 ENCOUNTER — Ambulatory Visit
Admission: RE | Admit: 2020-12-16 | Discharge: 2020-12-16 | Disposition: A | Discharge status: Home / Self Care | Payer: Medicare Other | Source: Ambulatory Visit | Attending: Rehabilitation | Admitting: Rehabilitation

## 2020-12-16 ENCOUNTER — Other Ambulatory Visit: Payer: Self-pay

## 2020-12-16 DIAGNOSIS — M48062 Spinal stenosis, lumbar region with neurogenic claudication: Secondary | ICD-10-CM

## 2020-12-23 ENCOUNTER — Other Ambulatory Visit: Payer: Medicare Other

## 2020-12-23 ENCOUNTER — Other Ambulatory Visit: Payer: Self-pay

## 2020-12-23 DIAGNOSIS — N401 Enlarged prostate with lower urinary tract symptoms: Secondary | ICD-10-CM

## 2020-12-23 DIAGNOSIS — N138 Other obstructive and reflux uropathy: Secondary | ICD-10-CM

## 2020-12-24 LAB — PSA, TOTAL AND FREE
PSA, Free Pct: 9.2 %
PSA, Free: 0.11 ng/mL
Prostate Specific Ag, Serum: 1.2 ng/mL (ref 0.0–4.0)

## 2021-01-05 ENCOUNTER — Ambulatory Visit: Payer: Medicare Other | Admitting: Urology

## 2021-01-12 ENCOUNTER — Ambulatory Visit (INDEPENDENT_AMBULATORY_CARE_PROVIDER_SITE_OTHER): Payer: Medicare Other | Admitting: Urology

## 2021-01-12 ENCOUNTER — Encounter: Payer: Self-pay | Admitting: Urology

## 2021-01-12 ENCOUNTER — Other Ambulatory Visit: Payer: Self-pay

## 2021-01-12 VITALS — BP 175/81 | HR 74 | Temp 98.8°F

## 2021-01-12 DIAGNOSIS — R361 Hematospermia: Secondary | ICD-10-CM | POA: Diagnosis not present

## 2021-01-12 DIAGNOSIS — N401 Enlarged prostate with lower urinary tract symptoms: Secondary | ICD-10-CM

## 2021-01-12 DIAGNOSIS — N138 Other obstructive and reflux uropathy: Secondary | ICD-10-CM | POA: Diagnosis not present

## 2021-01-12 DIAGNOSIS — R3915 Urgency of urination: Secondary | ICD-10-CM | POA: Diagnosis not present

## 2021-01-12 LAB — URINALYSIS, ROUTINE W REFLEX MICROSCOPIC
Bilirubin, UA: NEGATIVE
Glucose, UA: NEGATIVE
Ketones, UA: NEGATIVE
Leukocytes,UA: NEGATIVE
Nitrite, UA: NEGATIVE
Protein,UA: NEGATIVE
RBC, UA: NEGATIVE
Specific Gravity, UA: <1.005 — ABNORMAL LOW (ref 1.005–1.030)
Urobilinogen, Ur: 0.2 mg/dL (ref 0.2–1.0)
pH, UA: 5 (ref 5.0–7.5)

## 2021-01-12 MED ORDER — FINASTERIDE 5 MG PO TABS: 5.0000 mg | ORAL_TABLET | Freq: Every day | ORAL | 3 refills | Status: DC

## 2021-01-12 MED ORDER — TAMSULOSIN HCL 0.4 MG PO CAPS: 0.4000 mg | ORAL_CAPSULE | Freq: Every day | ORAL | 3 refills | Status: DC

## 2021-01-12 NOTE — Progress Notes (Signed)
Urological Symptom Review  Patient is experiencing the following symptoms: Erection problems (male only)   Review of Systems  Gastrointestinal (upper)  : Negative for upper GI symptoms  Gastrointestinal (lower) : Negative for lower GI symptoms  Constitutional : Negative for symptoms  Skin: Negative for skin symptoms  Eyes: Negative for eye symptoms  Ear/Nose/Throat : Sinus problems  Hematologic/Lymphatic: Negative for Hematologic/Lymphatic symptoms  Cardiovascular : Negative for cardiovascular symptoms  Respiratory : Negative for respiratory symptoms  Endocrine: Negative for endocrine symptoms  Musculoskeletal: Back pain Joint pain  Neurological: Negative for neurological symptoms  Psychologic: Negative for psychiatric symptoms

## 2021-01-12 NOTE — Progress Notes (Signed)
Subjective:  1. BPH with urinary obstruction   2. Urgency of urination   3. Hematospermia     He continues to void well on tamsulosin and finasteride. His IPSS is 3 with rare urgency. He has a prostate MRI for  recurrent episodes of hematospermia and the study showed blood and proteinaceous material in the right SV of uncertain significance and no mass was seen.    He has been treated with  cipro for about a month and bactrim prior to that for the hematospermia prior to his last visit.  He has had no further hematospermia.  He held his supplements for a couple of weeks which helped the bleeding but he was achy and went back on it.  He drinks a lot of fluid.  His PSA is 1.2 on 12/23/20 which is stable. He has no associated signs or symptoms.   IPSS     Row Name 01/12/21 1500         International Prostate Symptom Score   How often have you had the sensation of not emptying your bladder? Not at All     How often have you had to urinate less than every two hours? Not at All     How often have you found you stopped and started again several times when you urinated? Not at All     How often have you found it difficult to postpone urination? Less than 1 in 5 times     How often have you had a weak urinary stream? Less than 1 in 5 times     How often have you had to strain to start urination? Not at All     How many times did you typically get up at night to urinate? None     Total IPSS Score 2       Quality of Life due to urinary symptoms   If you were to spend the rest of your life with your urinary condition just the way it is now how would you feel about that? Pleased               ROS:  ROS:  A complete review of systems was performed.  All systems are negative except for pertinent findings as noted.   ROS  IPSS     Row Name 01/12/21 1500         International Prostate Symptom Score   How often have you had the sensation of not emptying your bladder? Not at All     How  often have you had to urinate less than every two hours? Not at All     How often have you found you stopped and started again several times when you urinated? Not at All     How often have you found it difficult to postpone urination? Less than 1 in 5 times     How often have you had a weak urinary stream? Less than 1 in 5 times     How often have you had to strain to start urination? Not at All     How many times did you typically get up at night to urinate? None     Total IPSS Score 2       Quality of Life due to urinary symptoms   If you were to spend the rest of your life with your urinary condition just the way it is now how would you feel about that? Pleased  Allergies  Allergen Reactions   Bacitracin Rash   Desonide Rash   Dicloxacillin Rash   Triamcinolone Rash    Outpatient Encounter Medications as of 01/12/2021  Medication Sig   cholecalciferol (VITAMIN D) 1000 units tablet Take 1,000 Units by mouth daily.   losartan-hydrochlorothiazide (HYZAAR) 100-25 MG tablet    Multiple Vitamin (MULTIVITAMIN WITH MINERALS) TABS tablet Take 1 tablet by mouth daily.   Multiple Vitamins-Minerals (PRESERVISION AREDS 2 PO) Take by mouth.   naproxen (NAPROSYN) 500 MG tablet Take 500 mg by mouth 2 (two) times daily with a meal.   traMADol (ULTRAM) 50 MG tablet Take by mouth every 6 (six) hours as needed.   vitamin B-12 (CYANOCOBALAMIN) 1000 MCG tablet Take 1,000 mcg by mouth daily.   vitamin C (ASCORBIC ACID) 500 MG tablet Take 500 mg by mouth daily.   [DISCONTINUED] finasteride (PROSCAR) 5 MG tablet Take 1 tablet (5 mg total) by mouth daily.   [DISCONTINUED] tamsulosin (FLOMAX) 0.4 MG CAPS capsule Take 1 capsule (0.4 mg total) by mouth daily.   finasteride (PROSCAR) 5 MG tablet Take 1 tablet (5 mg total) by mouth daily.   tamsulosin (FLOMAX) 0.4 MG CAPS capsule Take 1 capsule (0.4 mg total) by mouth daily.   No facility-administered encounter medications on file as of  01/12/2021.    Past Medical History:  Diagnosis Date   BPH (benign prostatic hyperplasia)    Diabetes mellitus without complication ([place])    Heart murmur    History of alcoholism ([place])    Hypertension     Past Surgical History:  Procedure Laterality Date   [facility] in [place] Bilateral 1990's   [facility] in [place], [place] Left 2010?   [facility] in [place], [place]?   [facility] in [place], [place]     on face-all done in office    NASAL SEPTUM SURGERY  1980's   [facility] [place] Right [facility]/10/2014   Procedure: REPAIR QUADRICEP TENDON;  Surgeon: Carole Civil, MD;  Location: [facility];  Service: Orthopedics;  Laterality: Right;   REPLACEMENT TOTAL KNEE Left 2000?   [facility] in [place], [place]    Social History   Socioeconomic History   Marital status: Married    Spouse name: Not on file   Number of children: Not on file   Years of education: Not on file   Highest education level: Not on file  Occupational History   Not on file  Tobacco Use   Smoking status: Former    Years: 5.00    Types: Cigarettes   Smokeless tobacco: Former   Tobacco comments:    hx of 1 pack per 2 weeks  Substance and Sexual Activity   Alcohol use: No    Comment: prior alcoholic - stopped 25 years as of 03/08/2014   Drug use: No   Sexual activity: Yes  Other Topics Concern   Not on file  Social History Narrative   Not on file   Social Determinants of Health   Financial Resource Strain: Not on file  Food Insecurity: Not on file  Transportation Needs: Not on file  Physical Activity: Not on file  Stress: Not on file  Social Connections: Not on file  Intimate Partner Violence: Not on file    Family History  Problem Relation Age of Onset   Diabetes Mother    Heart murmur Mother    Cancer Brother         Objective: Vitals:   01/12/21 1531  BP: (!) 175/81  Pulse: 74  Temp: 98.8 F (37.1 C)     Physical Exam Genitourinary:    Comments: AP without lesions. NST without mass. Prostate 1+ small and firm without nodules. SV non-palpable.   Lab Results:  UA is unremarkable.    Studies/Results:   Assessment & Plan: History of Hematospermia.   He has had no recurrent bleeding and the exam is unremarkable.  BPH with BOO and urgency.  He will continue finasteride and tamsulosin. Meds refilled.  He will get a PSA with upcoming labs and prior to f/u in a year.   Meds ordered this encounter  Medications   finasteride (PROSCAR) 5 MG tablet    Sig: Take 1 tablet (5 mg total) by mouth daily.    Dispense:  90 tablet    Refill:  3   tamsulosin (FLOMAX) 0.4 MG CAPS capsule    Sig: Take 1 capsule (0.4 mg total) by mouth daily.    Dispense:  90 capsule    Refill:  3      Orders Placed This Encounter  Procedures   Urinalysis, Routine w reflex microscopic   PSA, total and free    Standing Status:   Future    Standing Expiration Date:   01/12/2022      Return in about 1 year (around 01/12/2022) for With PSA.   CC: Celene Squibb, MD      Irine Seal 01/13/2021 Patient ID: Larry Graves, male   DOB: [birth year], 75 y.o.   MRN: [id number]

## 2021-07-06 ENCOUNTER — Other Ambulatory Visit: Payer: Medicare Other

## 2021-07-13 ENCOUNTER — Ambulatory Visit: Payer: Medicare Other | Admitting: Urology

## 2021-12-26 ENCOUNTER — Encounter (INDEPENDENT_AMBULATORY_CARE_PROVIDER_SITE_OTHER): Payer: Self-pay | Admitting: *Deleted

## 2022-01-01 ENCOUNTER — Other Ambulatory Visit: Payer: Self-pay | Admitting: Urology

## 2022-01-04 ENCOUNTER — Encounter: Payer: Self-pay | Admitting: Urology

## 2022-01-04 ENCOUNTER — Ambulatory Visit (INDEPENDENT_AMBULATORY_CARE_PROVIDER_SITE_OTHER): Payer: Medicare Other | Admitting: Urology

## 2022-01-04 ENCOUNTER — Other Ambulatory Visit: Payer: Medicare Other

## 2022-01-04 VITALS — BP 175/74 | HR 121

## 2022-01-04 DIAGNOSIS — R361 Hematospermia: Secondary | ICD-10-CM | POA: Diagnosis not present

## 2022-01-04 DIAGNOSIS — N138 Other obstructive and reflux uropathy: Secondary | ICD-10-CM | POA: Diagnosis not present

## 2022-01-04 DIAGNOSIS — N401 Enlarged prostate with lower urinary tract symptoms: Secondary | ICD-10-CM | POA: Diagnosis not present

## 2022-01-04 DIAGNOSIS — R3915 Urgency of urination: Secondary | ICD-10-CM | POA: Diagnosis not present

## 2022-01-04 MED ORDER — TAMSULOSIN HCL 0.4 MG PO CAPS: 0.4000 mg | ORAL_CAPSULE | Freq: Every day | ORAL | 3 refills | Status: DC

## 2022-01-04 MED ORDER — FINASTERIDE 5 MG PO TABS: 5.0000 mg | ORAL_TABLET | Freq: Every day | ORAL | 3 refills | Status: DC

## 2022-01-04 NOTE — Progress Notes (Signed)
Subjective:  1. BPH with urinary obstruction   2. Urgency of urination   3. Hematospermia     He continues to void well on tamsulosin and finasteride. His IPSS is 4 with rare urgency.  He has nocturia x 1. He has not had hemospermia recently but hasn't been sexually active.    He drinks a lot of fluid.  His PSA was 1.2 on 12/23/20 which is stable.  He had recent blood work but doesn't know if he had a PSA with Dr. [place] Crane.  He is to have a lumbar fusion by Dr. Patrice Paradise on 01/15/22.  He has no associated signs or symptoms.   IPSS     Row Name 01/04/22 1000         International Prostate Symptom Score   How often have you had the sensation of not emptying your bladder? Not at All     How often have you had to urinate less than every two hours? Not at All     How often have you found you stopped and started again several times when you urinated? Not at All     How often have you found it difficult to postpone urination? Less than half the time     How often have you had a weak urinary stream? Less than 1 in 5 times     How often have you had to strain to start urination? Not at All     How many times did you typically get up at night to urinate? 1 Time     Total IPSS Score 4       Quality of Life due to urinary symptoms   If you were to spend the rest of your life with your urinary condition just the way it is now how would you feel about that? Pleased                ROS:  ROS:  A complete review of systems was performed.  All systems are negative except for pertinent findings as noted.   Review of Systems  Musculoskeletal:  Positive for back pain.  Neurological:  Positive for focal weakness.        Allergies  Allergen Reactions   Bacitracin Rash   Desonide Rash   Dicloxacillin Rash   Triamcinolone Rash    Outpatient Encounter Medications as of 01/04/2022  Medication Sig   amLODipine (NORVASC) 5 MG tablet Take 5 mg by mouth daily.   cholecalciferol (VITAMIN D) 1000  units tablet Take 1,000 Units by mouth daily.   finasteride (PROSCAR) 5 MG tablet Take 1 tablet (5 mg total) by mouth daily.   losartan-hydrochlorothiazide (HYZAAR) 100-25 MG tablet    Multiple Vitamin (MULTIVITAMIN WITH MINERALS) TABS tablet Take 1 tablet by mouth daily.   Multiple Vitamins-Minerals (PRESERVISION AREDS 2 PO) Take by mouth.   naproxen (NAPROSYN) 500 MG tablet Take 500 mg by mouth 2 (two) times daily with a meal.   tamsulosin (FLOMAX) 0.4 MG CAPS capsule Take 1 capsule (0.4 mg total) by mouth daily.   traMADol (ULTRAM) 50 MG tablet Take by mouth every 6 (six) hours as needed.   vitamin B-12 (CYANOCOBALAMIN) 1000 MCG tablet Take 1,000 mcg by mouth daily.   vitamin C (ASCORBIC ACID) 500 MG tablet Take 500 mg by mouth daily.   [DISCONTINUED] finasteride (PROSCAR) 5 MG tablet Take 1 tablet (5 mg total) by mouth daily.   [DISCONTINUED] tamsulosin (FLOMAX) 0.4 MG CAPS capsule Take 1 capsule (0.4 mg  total) by mouth daily.   No facility-administered encounter medications on file as of 01/04/2022.    Past Medical History:  Diagnosis Date   BPH (benign prostatic hyperplasia)    Diabetes mellitus without complication ([place])    Heart murmur    History of alcoholism ([place])    Hypertension     Past Surgical History:  Procedure Laterality Date   [facility] in [place] Bilateral 1990's   [facility] in [place], [place] Left 2010?   [facility] in [place], [place]?   [facility] in [place], [place]     on face-all done in office    NASAL SEPTUM SURGERY  1980's   [facility] [place] Right [facility]/10/2014   Procedure: REPAIR QUADRICEP TENDON;  Surgeon: Carole Civil, MD;  Location: [facility];  Service: Orthopedics;  Laterality: Right;   REPLACEMENT TOTAL KNEE Left 2000?   [facility] in [place], [place]    Social  History   Socioeconomic History   Marital status: Married    Spouse name: Not on file   Number of children: Not on file   Years of education: Not on file   Highest education level: Not on file  Occupational History   Not on file  Tobacco Use   Smoking status: Former    Years: 5.00    Types: Cigarettes   Smokeless tobacco: Former   Tobacco comments:    hx of 1 pack per 2 weeks  Substance and Sexual Activity   Alcohol use: No    Comment: prior alcoholic - stopped 25 years as of 03/08/2014   Drug use: No   Sexual activity: Yes  Other Topics Concern   Not on file  Social History Narrative   Not on file   Social Determinants of Health   Financial Resource Strain: Not on file  Food Insecurity: Not on file  Transportation Needs: Not on file  Physical Activity: Not on file  Stress: Not on file  Social Connections: Not on file  Intimate Partner Violence: Not on file    Family History  Problem Relation Age of Onset   Diabetes Mother    Heart murmur Mother    Cancer Brother        Objective: Vitals:   01/04/22 0948  BP: (!) 175/74  Pulse: (!) 121     Physical Exam Vitals reviewed.  Constitutional:      Appearance: Normal appearance.  Genitourinary:    Comments: AP without lesions. NST without mass. Prostate 1+ small and firm without nodules. SV non-palpable. Neurological:     Mental Status: He is alert.     Lab Results:  UA is unremarkable.    Studies/Results:   Assessment & Plan: History of Hematospermia.   He has had no recurrent bleeding and the exam is unremarkable.  BPH with BOO and urgency.  He will continue finasteride and tamsulosin. Meds refilled.  He will get a PSA prior to f/u in a year.   Meds ordered this encounter  Medications   finasteride (PROSCAR) 5 MG tablet    Sig: Take 1 tablet (5 mg total) by mouth daily.    Dispense:  90 tablet    Refill:  3   tamsulosin (FLOMAX) 0.4 MG CAPS capsule    Sig: Take 1  capsule (0.4 mg  total) by mouth daily.    Dispense:  90 capsule    Refill:  3      Orders Placed This Encounter  Procedures   Urinalysis, Routine w reflex microscopic   PSA    Standing Status:   Future    Standing Expiration Date:   01/05/2023      Return in about 1 year (around 01/05/2023) for PSA return.   CC: Celene Squibb, MD      Irine Seal 01/04/2022 Patient ID: Chauncy Lean, male   DOB: Jan 27, 1946, 76 y.o.   MRN: [id number]

## 2022-01-05 LAB — URINALYSIS, ROUTINE W REFLEX MICROSCOPIC
Bilirubin, UA: NEGATIVE
Leukocytes,UA: NEGATIVE
Nitrite, UA: NEGATIVE
Protein,UA: NEGATIVE
RBC, UA: NEGATIVE
Specific Gravity, UA: 1.015 (ref 1.005–1.030)
Urobilinogen, Ur: 0.2 mg/dL (ref 0.2–1.0)
pH, UA: 6 (ref 5.0–7.5)

## 2022-01-14 ENCOUNTER — Encounter: Payer: Self-pay | Admitting: Cardiovascular Disease
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
## 2022-01-15 ENCOUNTER — Encounter: Payer: Self-pay | Admitting: Cardiovascular Disease

## 2022-01-15 ENCOUNTER — Ambulatory Visit: Payer: Medicare Other | Attending: Cardiovascular Disease | Admitting: Cardiovascular Disease

## 2022-01-15 VITALS — BP 130/78 | HR 92 | Ht 63.0 in | Wt 170.4 lb

## 2022-01-15 DIAGNOSIS — R9431 Abnormal electrocardiogram [ECG] [EKG]: Secondary | ICD-10-CM

## 2022-01-15 DIAGNOSIS — R011 Cardiac murmur, unspecified: Secondary | ICD-10-CM | POA: Diagnosis not present

## 2022-01-15 DIAGNOSIS — I350 Nonrheumatic aortic (valve) stenosis: Secondary | ICD-10-CM

## 2022-01-15 NOTE — Patient Instructions (Signed)
Medication Instructions:  Your physician recommends that you continue on your current medications as directed. Please refer to the Current Medication list given to you today.  *If you need a refill on your cardiac medications before your next appointment, please call your pharmacy*   Lab Work: NONE If you have labs (blood work) drawn today and your tests are completely normal, you will receive your results only by: [place] (if you have MyChart) OR A paper copy in the mail If you have any lab test that is abnormal or we need to change your treatment, we will call you to review the results.   Testing/Procedures: NONE   Follow-Up: At [facility], you and your health needs are our priority.  As part of our continuing mission to provide you with exceptional heart care, we have created designated Provider Care Teams.  These Care Teams include your primary Cardiologist (physician) and Advanced Practice Providers (APPs -  Physician Assistants and Nurse Practitioners) who all work together to provide you with the care you need, when you need it.  We recommend signing up for the patient portal called "MyChart".  Sign up information is provided on this After Visit Summary.  MyChart is used to connect with patients for Virtual Visits (Telemedicine).  Patients are able to view lab/test results, encounter notes, upcoming appointments, etc.  Non-urgent messages can be sent to your provider as well.   To learn more about what you can do with MyChart, go to [email or web address].    Your next appointment:   3 month(s)  The format for your next appointment:   In Person  Provider:   Mertie Moores, MD    Important Information About Sugar

## 2022-01-23 ENCOUNTER — Telehealth (INDEPENDENT_AMBULATORY_CARE_PROVIDER_SITE_OTHER): Payer: Self-pay | Admitting: Internal Medicine

## 2022-01-23 NOTE — Telephone Encounter (Signed)
Called in regard to colonoscopy. Patient is awaiting back surgery and will schedule colonoscopy afterwards.

## 2022-01-23 NOTE — Telephone Encounter (Signed)
noted 

## 2022-01-31 ENCOUNTER — Telehealth: Payer: Self-pay | Admitting: *Deleted

## 2022-01-31 NOTE — Telephone Encounter (Signed)
Per Dr. Jenetta Downer, ASA 3, 2 day prep.  Pt was called on 11/8, 11/22 and LMTCB. Patient scheduled for back surgery 03/07/22. Letter mailed

## 2022-01-31 NOTE — Telephone Encounter (Signed)
Referring MD/PCP: Dr. Nevada Crane  Procedure: Colonoscopy  Reason/Indication:  Hx polyps  Has patient had this procedure before?  yes  If so, when, by whom and where?    Is there a family history of colon cancer?  no  Who?  What age when diagnosed?    Is patient diabetic? If yes, Type 1 or Type 2   yes type 2      Does patient have prosthetic heart valve or mechanical valve?  no  Do you have a pacemaker/defibrillator?  no  Has patient ever had endocarditis/atrial fibrillation? no  Does patient use oxygen? no  Has patient had joint replacement within last 12 months?  no  Is patient constipated or do they take laxatives? no  Does patient have a history of alcohol/drug use?  yes  Have you had a stroke/heart attack last 6 mths? no  Do you take medicine for weight loss?  no  Is patient on blood thinner such as Coumadin, Plavix and/or Aspirin? no  Medications:  Current Outpatient Medications on File Prior to Visit  Medication Sig Dispense Refill   amLODipine (NORVASC) 5 MG tablet Take 5 mg by mouth daily.     cholecalciferol (VITAMIN D) 1000 units tablet Take 1,000 Units by mouth daily.     finasteride (PROSCAR) 5 MG tablet Take 1 tablet (5 mg total) by mouth daily. 90 tablet 3   losartan-hydrochlorothiazide (HYZAAR) 100-25 MG tablet      Multiple Vitamin (MULTIVITAMIN WITH MINERALS) TABS tablet Take 1 tablet by mouth daily.     Multiple Vitamins-Minerals (PRESERVISION AREDS 2 PO) Take by mouth.     naproxen (NAPROSYN) 500 MG tablet Take 500 mg by mouth 2 (two) times daily with a meal.     tamsulosin (FLOMAX) 0.4 MG CAPS capsule Take 1 capsule (0.4 mg total) by mouth daily. 90 capsule 3   traMADol (ULTRAM) 50 MG tablet Take by mouth every 6 (six) hours as needed.     vitamin B-12 (CYANOCOBALAMIN) 1000 MCG tablet Take 1,000 mcg by mouth daily.     vitamin C (ASCORBIC ACID) 500 MG tablet Take 500 mg by mouth daily.     No current facility-administered medications on file prior to  visit.     Allergies:  Allergies  Allergen Reactions   Bacitracin Rash   Desonide Rash   Dicloxacillin Rash   Triamcinolone Rash

## 2022-03-07 HISTORY — PX: BACK SURGERY: SHX140

## 2022-04-15 ENCOUNTER — Encounter: Payer: Self-pay | Admitting: Cardiovascular Disease
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
## 2022-04-18 ENCOUNTER — Encounter: Payer: Self-pay | Admitting: Cardiovascular Disease

## 2022-04-18 ENCOUNTER — Ambulatory Visit: Payer: Medicare Other | Attending: Cardiovascular Disease | Admitting: Cardiovascular Disease

## 2022-04-18 VITALS — BP 150/70 | HR 103 | Ht 63.0 in | Wt 182.6 lb

## 2022-04-18 DIAGNOSIS — I340 Nonrheumatic mitral (valve) insufficiency: Secondary | ICD-10-CM | POA: Diagnosis not present

## 2022-04-18 DIAGNOSIS — I10 Essential (primary) hypertension: Secondary | ICD-10-CM | POA: Diagnosis not present

## 2022-04-18 DIAGNOSIS — I350 Nonrheumatic aortic (valve) stenosis: Secondary | ICD-10-CM

## 2022-04-18 NOTE — Patient Instructions (Signed)
Medication Instructions:  Your physician recommends that you continue on your current medications as directed. Please refer to the Current Medication list given to you today.  *If you need a refill on your cardiac medications before your next appointment, please call your pharmacy*   Lab Work: NONE If you have labs (blood work) drawn today and your tests are completely normal, you will receive your results only by: MyChart Message (if you have MyChart) OR A paper copy in the mail If you have any lab test that is abnormal or we need to change your treatment, we will call you to review the results.   Testing/Procedures: ECHO Your physician has requested that you have an echocardiogram. Echocardiography is a painless test that uses sound waves to create images of your heart. It provides your doctor with information about the size and shape of your heart and how well your heart's chambers and valves are working. This procedure takes approximately one hour. There are no restrictions for this procedure. Please do NOT wear cologne, perfume, aftershave, or lotions (deodorant is allowed). Please arrive 15 minutes prior to your appointment time.  Follow-Up: At [facility], you and your health needs are our priority.  As part of our continuing mission to provide you with exceptional heart care, we have created designated Provider Care Teams.  These Care Teams include your primary Cardiologist (physician) and Advanced Practice Providers (APPs -  Physician Assistants and Nurse Practitioners) who all work together to provide you with the care you need, when you need it.  We recommend signing up for the patient portal called "MyChart".  Sign up information is provided on this After Visit Summary.  MyChart is used to connect with patients for Virtual Visits (Telemedicine).  Patients are able to view lab/test results, encounter notes, upcoming appointments, etc.  Non-urgent messages can be sent to  your provider as well.   To learn more about what you can do with MyChart, go to [email or web address].    Your next appointment:   1 year(s)  Provider:   Philip Nahser, MD      

## 2022-05-03 ENCOUNTER — Ambulatory Visit (HOSPITAL_COMMUNITY): Payer: Medicare Other | Attending: Cardiology

## 2022-05-03 DIAGNOSIS — I340 Nonrheumatic mitral (valve) insufficiency: Secondary | ICD-10-CM | POA: Diagnosis present

## 2022-05-03 DIAGNOSIS — I10 Essential (primary) hypertension: Secondary | ICD-10-CM | POA: Diagnosis present

## 2022-05-03 DIAGNOSIS — I350 Nonrheumatic aortic (valve) stenosis: Secondary | ICD-10-CM | POA: Diagnosis present

## 2022-05-03 LAB — ECHOCARDIOGRAM COMPLETE
AR max vel: 1.5 cm2
AV Area VTI: 1.48 cm2
AV Area mean vel: 1.47 cm2
AV Mean grad: 13 mmHg
AV Peak grad: 25.2 mmHg
Ao pk vel: 2.51 m/s
Area-P 1/2: 4.93 cm2
P 1/2 time: 395 msec
S' Lateral: 3.1 cm

## 2022-06-11 ENCOUNTER — Telehealth (INDEPENDENT_AMBULATORY_CARE_PROVIDER_SITE_OTHER): Payer: Self-pay | Admitting: Gastroenterology

## 2022-06-11 MED ORDER — PEG 3350-KCL-NA BICARB-NACL 420 G PO SOLR: 4000.0000 mL | Freq: Once | ORAL | 0 refills | Status: AC

## 2022-06-11 NOTE — Telephone Encounter (Signed)
Pt left voicemail wanting to schedule TCS. Pt had been sent questionnaire back in [place] and pt was contacted in November.  Returned call to patient. Pt has positive cologuard since questionnaire. Pt scheduled for 06/27/22 (room 3 2 day prep per telephone call in November). Instructions mailed to patient. Will call pt with pre op appt. Prep sent to pharmacy.   PA via [facility] Notification or Prior Authorization is not required for the requested services You are not required to submit a notification/prior authorization based on the information provided. The number above acknowledges your inquiry and our response. Please write this number down and refer to it for future inquiries. If you still wish to submit your request for review, please select the Continue with Submission button below. Decision ID #: [id number]

## 2022-06-11 NOTE — Telephone Encounter (Signed)
Pt left voicemail wanting to schedule colonoscopy. Pt had recall letter sent back in October and was contacted in November. Contacted pt back and scheduled TCS for 06/27/22. Pt states he had a positive cologuard since questionnaire. Prep sent to pharmacy. Instructions will be mailed to patient. Prep sent to pharmacy.   Per [facility] Notification or Prior Authorization is not required for the requested services You are not required to submit a notification/prior authorization based on the information provided. The number above acknowledges your inquiry and our response. Please write this number down and refer to it for future inquiries. If you still wish to submit your request for review, please select the Continue with Submission button below. Decision ID #: [id number]

## 2022-06-20 ENCOUNTER — Telehealth (INDEPENDENT_AMBULATORY_CARE_PROVIDER_SITE_OTHER): Payer: Self-pay | Admitting: Gastroenterology

## 2022-06-20 NOTE — Telephone Encounter (Signed)
Pt contacted and made aware of pre op appt scheduled for 06/25/22 in person at [facility] at 1:45pm

## 2022-06-22 ENCOUNTER — Other Ambulatory Visit: Payer: Self-pay

## 2022-06-22 ENCOUNTER — Ambulatory Visit (HOSPITAL_COMMUNITY): Payer: Medicare Other | Attending: Orthopaedic Surgery

## 2022-06-22 DIAGNOSIS — M48062 Spinal stenosis, lumbar region with neurogenic claudication: Secondary | ICD-10-CM | POA: Diagnosis present

## 2022-06-22 DIAGNOSIS — M5459 Other low back pain: Secondary | ICD-10-CM | POA: Diagnosis present

## 2022-06-22 NOTE — Therapy (Signed)
OUTPATIENT PHYSICAL THERAPY THORACOLUMBAR EVALUATION   Patient Name: Larry Graves MRN: [id number] DOB:January 17, 1946, 77 y.o., male Today's Date: 06/22/2022  END OF SESSION:  PT End of Session - 06/22/22 0939     Visit Number 1    Number of Visits 8    Date for PT Re-Evaluation 07/20/22    Authorization Type [employer] Medicare    PT Start Time 0845    PT Stop Time 0930    PT Time Calculation (min) 45 min    Activity Tolerance Patient tolerated treatment well    Behavior During Therapy WFL for tasks assessed/performed            Past Medical History:  Diagnosis Date   BPH (benign prostatic hyperplasia)    Diabetes mellitus without complication (HCC)    Heart murmur    History of alcoholism (HCC)    Hypertension    Past Surgical History:  Procedure Laterality Date   APPENDECTOMY  1964   [facility] in [place], [place]   CARPAL TUNNEL RELEASE Bilateral 1990's   [facility] in [place], [place]   FOOT SURGERY Left 2010?   bunionectomy-[facility] in [place], [place]   HERNIA REPAIR  9629?   [facility] in [place], [place]   MELANOMA EXCISION     on face-all done in office    NASAL SEPTUM SURGERY  1980's   [facility] in [place], [place]   QUADRICEPS TENDON REPAIR Right 03/10/2014   Procedure: REPAIR QUADRICEP TENDON;  Surgeon: Vickki Hearing, MD;  Location: [facility];  Service: Orthopedics;  Laterality: Right;   REPLACEMENT TOTAL KNEE Left 2000?   [facility] in [place], [place]   Patient Active Problem List   Diagnosis Date Noted   Hematospermia 04/17/2019   Urgency of urination 04/17/2019   BPH with urinary obstruction 04/17/2019   Quadriceps muscle rupture     PCP: Benita Stabile., MD  REFERRING PROVIDER: Patricia Nettle, MD  REFERRING DIAG: spinal stenosis, lumbar region w/ neurogenic claudication  Rationale for Evaluation and Treatment: Rehabilitation  THERAPY DIAG:  Other low back pain  Spinal stenosis of lumbar region with neurogenic  claudication  ONSET DATE: 03/07/22  SUBJECTIVE:                                                                                                                                                                                           SUBJECTIVE STATEMENT: Arrives to the clinic with some soreness on the low back. Denies any numbness/tingling and weakness on the legs. Patient is S/P L4-S1 Laminectomy and fusion with TLIF, instrumentation, allograft (03/07/22). Recent  MD visit referred patient to outpatient PT evaluation and management  PERTINENT HISTORY:  L4-S1 Laminectomy and fusion with TLIF, instrumentation, allograft (03/07/22), L TKR  PAIN:  Are you having pain? Yes: NPRS scale: 1/10 Pain location: across the back Pain description: aching, intermittent Aggravating factors: bending over Relieving factors: pain medications  PRECAUTIONS: Other: not to lift > 50 lbs from the floor  WEIGHT BEARING RESTRICTIONS: No  FALLS:  Has patient fallen in last 6 months? No  LIVING ENVIRONMENT: Lives with: lives with their spouse Lives in: House/apartment Stairs: Yes: External: 5 steps; bilateral but cannot reach both Has following equipment at home: Dan Humphreys - 2 wheeled  OCCUPATION: retired  PLOF: Independent and Independent with basic ADLs  PATIENT GOALS: "to get my movement back"  NEXT MD VISIT: September 2024  OBJECTIVE:   DIAGNOSTIC FINDINGS:  EXAM: 12/20/2020 MRI LUMBAR SPINE WITHOUT CONTRAST   TECHNIQUE: Multiplanar, multisequence MR imaging of the lumbar spine was performed. No intravenous contrast was administered.   COMPARISON:  None.   FINDINGS: Technical Note: Despite efforts by the technologist and patient, motion artifact is present on today's exam and could not be eliminated. This reduces exam sensitivity and specificity.   Segmentation:  Standard.   Alignment: 4 mm retrolisthesis L4 on L5. Trace retrolisthesis L2 on L3.   Vertebrae: No fracture, evidence  of discitis, or bone lesion. Discogenic endplate marrow changes most pronounced at L5-S1.   Conus medullaris and cauda equina: Conus extends to the L1-2 level. Conus and cauda equina appear normal.   Paraspinal and other soft tissues: Colonic diverticulosis.   Disc levels:   T12-L1: Tiny shallow central disc protrusion. No foraminal or canal stenosis.   L1-L2: No disc protrusion. Minimal facet arthropathy. No foraminal or canal stenosis.   L2-L3: Retrolisthesis with mild diffuse disc bulge. Mild bilateral facet arthropathy. Mild canal stenosis without significant foraminal stenosis.   L3-L4: No disc protrusion. Mild bilateral facet arthropathy. No foraminal or canal stenosis.   L4-L5: Retrolisthesis with prominent endplate ridging. Bilateral facet arthropathy and ligamentum flavum buckling. Findings result in moderate to severe canal stenosis with severe bilateral foraminal stenosis.   L5-S1: Diffuse disc bulge with endplate ridging. Advanced bilateral facet arthropathy. Severe bilateral foraminal stenosis. No canal stenosis.   IMPRESSION: 1. Multilevel lumbar spondylosis greatest at the L4-5 level where there is moderate-to-severe canal stenosis and severe bilateral foraminal stenosis. 2. Severe bilateral foraminal stenosis at L5-S1. 3. Mild canal stenosis at L2-L3.  PATIENT SURVEYS:  FOTO 53  SCREENING FOR RED FLAGS: Bowel or bladder incontinence: No  COGNITION: Overall cognitive status: Within functional limits for tasks assessed     SENSATION: Not tested  MUSCLE LENGTH: Moderate restriction on B hamstrings, and piriformis Mild restriction on B hip flexors  POSTURE: rounded shoulders, forward head, and decreased lumbar lordosis   LUMBAR ROM:   AROM eval  Flexion 75%  Extension 25%  Right lateral flexion   Left lateral flexion   Right rotation   Left rotation    (Blank rows = not tested)  LOWER EXTREMITY ROM:     Active  Right eval Left eval   Hip flexion [facility]  Hip extension [facility]  Hip abduction [facility]  Hip adduction    Hip internal rotation    Hip external rotation    Knee flexion [facility]  Knee extension [facility]  Ankle dorsiflexion    Ankle plantarflexion    Ankle inversion    Ankle eversion     (Blank  rows = not tested)  LOWER EXTREMITY MMT:    MMT Right eval Left eval  Hip flexion 4 4  Hip extension 3+ 3+  Hip abduction 3+ 3+  Hip adduction    Hip internal rotation    Hip external rotation    Knee flexion 5 5  Knee extension 5 5  Ankle dorsiflexion 5 5  Ankle plantarflexion 5 5  Ankle inversion    Ankle eversion     (Blank rows = not tested)  FUNCTIONAL TESTS:  5 times sit to stand: 7.63 sec 2 minute walk test: 467 ft  GAIT: Distance walked: 467 ft Assistive device utilized: None Level of assistance: Complete Independence Comments: Trendelenburg gait  TODAY'S TREATMENT:                                                                                                                              DATE:  06/22/22 Evaluation and patient education Seated hamstring stretch x 30" x 3 Seated piriformis stretch x 30" x 3  PATIENT EDUCATION:  Education details: Educated on the pathoanatomy of low back pain. Educated on the goals and course of rehab. Written HEP provided and reviewed Person educated: Patient Education method: Explanation, Demonstration, Verbal cues, and Handouts Education comprehension: verbalized understanding and returned demonstration  HOME EXERCISE PROGRAM: Access Code: [email or web address] URL: [email or web address] Date: 06/22/2022 Prepared by: Krystal Clark  Exercises - Seated Hamstring Stretch  - 2 x daily - 7 x weekly - 3 reps - 30 hold - Seated Piriformis Stretch  - 2 x daily - 7 x weekly - 3 reps - 30 hold  ASSESSMENT:  CLINICAL IMPRESSION: Patient is a 77 y.o. male who was seen today for physical therapy evaluation and treatment for spinal stenosis, lumbar  region w/ neurogenic claudication. Patient is also s/p spinal stenosis, lumbar region w/ neurogenic claudication. Patient was diagnosed with spinal stenosis, lumbar region w/ neurogenic claudication by referring provider further defined by difficulty bending, carrying, lifting due to pain, weakness, and decreased soft tissue extensibility. Skilled PT is required to address the impairments and functional limitations listed below.   OBJECTIVE IMPAIRMENTS: Abnormal gait, decreased activity tolerance, decreased ROM, decreased strength, impaired flexibility, and pain.   ACTIVITY LIMITATIONS: carrying, lifting, and bending  PARTICIPATION LIMITATIONS: meal prep, cleaning, laundry, community activity, and yard work  PERSONAL FACTORS: Age and Time since onset of injury/illness/exacerbation are also affecting patient's functional outcome.   REHAB POTENTIAL: Good  CLINICAL DECISION MAKING: Stable/uncomplicated  EVALUATION COMPLEXITY: Low   GOALS: Goals reviewed with patient? Yes  SHORT TERM GOALS: Target date: 07/06/2022  Pt will demonstrate indep in HEP to facilitate carry-over of skilled services and improve functional outcomes  LONG TERM GOALS: Target date: 07/20/2022  Pt will increase FOTO to at least 60 in order to demonstrate significant improvement in function related to bending over and ADLs Baseline: 53 Goal status: INITIAL  2.  Pt will increase by at least 40 ft in  order to demonstrate clinically significant improvement in community ambulation  Baseline: 467 ft Goal status: INITIAL  3.  Pt will demonstrate increase in lumbar flex ROM by 25%  to facilitate ease in ADLs  Baseline: 75% Goal status: INITIAL  4.  Pt will demonstrate increase in LE strength to 4+/5  to facilitate ease and safety in ambulation Baseline: 3+/5 Goal status: INITIAL  PLAN:  PT FREQUENCY: 2x/week  PT DURATION: 4 weeks  PLANNED INTERVENTIONS: Therapeutic exercises, Therapeutic activity,  Neuromuscular re-education, Balance training, Gait training, Patient/Family education, Self Care, and Manual therapy.  PLAN FOR NEXT SESSION: May begin LE flexibility, core and hip strengthening. Update HEP.   Tish Frederickson. Tiger Spieker, PT, DPT, OCS Board-Certified Clinical Specialist in Orthopedic PT PT Compact Privilege # ([place]): [id number] T 06/22/2022, 3:14 PM

## 2022-06-22 NOTE — Patient Instructions (Signed)
Larry Graves  06/22/2022     @PREFPERIOPPHARMACY @   Your procedure is scheduled on  06/27/2022.   Report to [facility] at  1115  A.M.   Call this number if you have problems the morning of surgery:  [phone]  If you experience any cold or flu symptoms such as cough, fever, chills, shortness of breath, etc. between now and your scheduled surgery, please notify us at the above number.   Remember:  Follow the diet and prep instructions given to you by the office.     Take these medicines the morning of surgery with A SIP OF WATER                proscar, flomax, ultram (if needed).     Do not wear jewelry, make-up or nail polish.  Do not wear lotions, powders, or perfumes, or deodorant.  Do not shave 48 hours prior to surgery.  Men may shave face and neck.  Do not bring valuables to the hospital.  [facility] is not responsible for any belongings or valuables.  Contacts, dentures or bridgework may not be worn into surgery.  Leave your suitcase in the car.  After surgery it may be brought to your room.  For patients admitted to the hospital, discharge time will be determined by your treatment team.  Patients discharged the day of surgery will not be allowed to drive home and must have someone with them for 24 hours.    Special instructions:   DO NOT smoke tobacco or vape for 24 hours before your procedure.  Please read over the following fact sheets that you were given. Anesthesia Post-op Instructions and Care and Recovery After Surgery      Colonoscopy, Adult, Care After The following information offers guidance on how to care for yourself after your procedure. Your health care provider may also give you more specific instructions. If you have problems or questions, contact your health care provider. What can I expect after the procedure? After the procedure, it is common to have: A small amount of blood in your stool for 24 hours after the procedure. Some  gas. Mild cramping or bloating of your abdomen. Follow these instructions at home: Eating and drinking  Drink enough fluid to keep your urine pale yellow. Follow instructions from your health care provider about eating or drinking restrictions. Resume your normal diet as told by your health care provider. Avoid heavy or fried foods that are hard to digest. Activity Rest as told by your health care provider. Avoid sitting for a long time without moving. Get up to take short walks every 1-2 hours. This is important to improve blood flow and breathing. Ask for help if you feel weak or unsteady. Return to your normal activities as told by your health care provider. Ask your health care provider what activities are safe for you. Managing cramping and bloating  Try walking around when you have cramps or feel bloated. If directed, apply heat to your abdomen as told by your health care provider. Use the heat source that your health care provider recommends, such as a moist heat pack or a heating pad. Place a towel between your skin and the heat source. Leave the heat on for 20-30 minutes. Remove the heat if your skin turns bright red. This is especially important if you are unable to feel pain, heat, or cold. You have a greater risk of getting burned. General instructions If  you were given a sedative during the procedure, it can affect you for several hours. Do not drive or operate machinery until your health care provider says that it is safe. For the first 24 hours after the procedure: Do not sign important documents. Do not drink alcohol. Do your regular daily activities at a slower pace than normal. Eat soft foods that are easy to digest. Take over-the-counter and prescription medicines only as told by your health care provider. Keep all follow-up visits. This is important. Contact a health care provider if: You have blood in your stool 2-3 days after the procedure. Get help right away  if: You have more than a small spotting of blood in your stool. You have large blood clots in your stool. You have swelling of your abdomen. You have nausea or vomiting. You have a fever. You have increasing pain in your abdomen that is not relieved with medicine. These symptoms may be an emergency. Get help right away. Call 911. Do not wait to see if the symptoms will go away. Do not drive yourself to the hospital. Summary After the procedure, it is common to have a small amount of blood in your stool. You may also have mild cramping and bloating of your abdomen. If you were given a sedative during the procedure, it can affect you for several hours. Do not drive or operate machinery until your health care provider says that it is safe. Get help right away if you have a lot of blood in your stool, nausea or vomiting, a fever, or increased pain in your abdomen. This information is not intended to replace advice given to you by your health care provider. Make sure you discuss any questions you have with your health care provider. Document Revised: 10/19/2020 Document Reviewed: 10/19/2020 Elsevier Patient Education  2023 Elsevier Inc. Monitored Anesthesia Care, Care After The following information offers guidance on how to care for yourself after your procedure. Your health care provider may also give you more specific instructions. If you have problems or questions, contact your health care provider. What can I expect after the procedure? After the procedure, it is common to have: Tiredness. Little or no memory about what happened during or after the procedure. Impaired judgment when it comes to making decisions. Nausea or vomiting. Some trouble with balance. Follow these instructions at home: For the time period you were told by your health care provider:  Rest. Do not participate in activities where you could fall or become injured. Do not drive or use machinery. Do not drink  alcohol. Do not take sleeping pills or medicines that cause drowsiness. Do not make important decisions or sign legal documents. Do not take care of children on your own. Medicines Take over-the-counter and prescription medicines only as told by your health care provider. If you were prescribed antibiotics, take them as told by your health care provider. Do not stop using the antibiotic even if you start to feel better. Eating and drinking Follow instructions from your health care provider about what you may eat and drink. Drink enough fluid to keep your urine pale yellow. If you vomit: Drink clear fluids slowly and in small amounts as you are able. Clear fluids include water, ice chips, low-calorie sports drinks, and fruit juice that has water added to it (diluted fruit juice). Eat light and bland foods in small amounts as you are able. These foods include bananas, applesauce, rice, lean meats, toast, and crackers. General instructions  Have a  responsible adult stay with you for the time you are told. It is important to have someone help care for you until you are awake and alert. If you have sleep apnea, surgery and some medicines can increase your risk for breathing problems. Follow instructions from your health care provider about wearing your sleep device: When you are sleeping. This includes during daytime naps. While taking prescription pain medicines, sleeping medicines, or medicines that make you drowsy. Do not use any products that contain nicotine or tobacco. These products include cigarettes, chewing tobacco, and vaping devices, such as e-cigarettes. If you need help quitting, ask your health care provider. Contact a health care provider if: You feel nauseous or vomit every time you eat or drink. You feel light-headed. You are still sleepy or having trouble with balance after 24 hours. You get a rash. You have a fever. You have redness or swelling around the IV site. Get help  right away if: You have trouble breathing. You have new confusion after you get home. These symptoms may be an emergency. Get help right away. Call 911. Do not wait to see if the symptoms will go away. Do not drive yourself to the hospital. This information is not intended to replace advice given to you by your health care provider. Make sure you discuss any questions you have with your health care provider. Document Revised: 07/24/2021 Document Reviewed: 07/24/2021 Elsevier Patient Education  2023 [employer].

## 2022-06-25 ENCOUNTER — Encounter (HOSPITAL_COMMUNITY)
Admission: RE | Admit: 2022-06-25 | Discharge: 2022-06-25 | Disposition: A | Discharge status: Home / Self Care | Payer: Medicare Other | Source: Ambulatory Visit | Attending: Gastroenterology | Admitting: Gastroenterology

## 2022-06-25 ENCOUNTER — Encounter (HOSPITAL_COMMUNITY): Payer: Self-pay

## 2022-06-25 VITALS — BP 122/76 | HR 88 | Temp 97.6°F | Resp 18 | Ht 63.0 in | Wt 182.5 lb

## 2022-06-25 DIAGNOSIS — Z01812 Encounter for preprocedural laboratory examination: Secondary | ICD-10-CM | POA: Diagnosis present

## 2022-06-25 DIAGNOSIS — E119 Type 2 diabetes mellitus without complications: Secondary | ICD-10-CM | POA: Diagnosis not present

## 2022-06-25 LAB — BASIC METABOLIC PANEL
Anion gap: 9 (ref 5–15)
BUN: 21 mg/dL (ref 8–23)
CO2: 24 mmol/L (ref 22–32)
Calcium: 9 mg/dL (ref 8.9–10.3)
Chloride: 94 mmol/L — ABNORMAL LOW (ref 98–111)
Creatinine, Ser: 1.07 mg/dL (ref 0.61–1.24)
GFR, Estimated: >60 mL/min (ref >60)
Glucose, Bld: 201 mg/dL — ABNORMAL HIGH (ref 70–99)
Potassium: 3.7 mmol/L (ref 3.5–5.1)
Sodium: 127 mmol/L — ABNORMAL LOW (ref 135–145)

## 2022-06-26 ENCOUNTER — Other Ambulatory Visit: Payer: Self-pay | Admitting: Gastroenterology

## 2022-06-26 DIAGNOSIS — E871 Hypo-osmolality and hyponatremia: Secondary | ICD-10-CM

## 2022-06-26 MED ORDER — SODIUM CHLORIDE 1 G PO TABS: 1.0000 g | ORAL_TABLET | Freq: Three times a day (TID) | ORAL | 0 refills | Status: AC

## 2022-06-26 NOTE — OR Nursing (Signed)
Patient aware that Dr. Levon Hedger is calling you in a prescription for your low level of Na+. Patient will start taking today.

## 2022-06-27 ENCOUNTER — Other Ambulatory Visit: Payer: Self-pay

## 2022-06-27 ENCOUNTER — Ambulatory Visit (HOSPITAL_COMMUNITY): Payer: Medicare Other | Admitting: Anesthesiology

## 2022-06-27 ENCOUNTER — Ambulatory Visit (HOSPITAL_BASED_OUTPATIENT_CLINIC_OR_DEPARTMENT_OTHER): Payer: Medicare Other | Admitting: Anesthesiology

## 2022-06-27 ENCOUNTER — Encounter (HOSPITAL_COMMUNITY)
Admission: RE | Disposition: A | Discharge status: Home / Self Care | Payer: Self-pay | Source: Ambulatory Visit | Attending: Gastroenterology

## 2022-06-27 ENCOUNTER — Ambulatory Visit (HOSPITAL_COMMUNITY)
Admission: RE | Admit: 2022-06-27 | Discharge: 2022-06-27 | Disposition: A | Discharge status: Home / Self Care | Payer: Medicare Other | Source: Ambulatory Visit | Attending: Gastroenterology | Admitting: Gastroenterology

## 2022-06-27 ENCOUNTER — Encounter (HOSPITAL_COMMUNITY): Payer: Self-pay | Admitting: Gastroenterology

## 2022-06-27 DIAGNOSIS — K648 Other hemorrhoids: Secondary | ICD-10-CM | POA: Diagnosis not present

## 2022-06-27 DIAGNOSIS — Z1211 Encounter for screening for malignant neoplasm of colon: Secondary | ICD-10-CM | POA: Insufficient documentation

## 2022-06-27 DIAGNOSIS — D122 Benign neoplasm of ascending colon: Secondary | ICD-10-CM

## 2022-06-27 DIAGNOSIS — Z860100 Personal history of colon polyps, unspecified: Secondary | ICD-10-CM

## 2022-06-27 DIAGNOSIS — I10 Essential (primary) hypertension: Secondary | ICD-10-CM | POA: Insufficient documentation

## 2022-06-27 DIAGNOSIS — Z87891 Personal history of nicotine dependence: Secondary | ICD-10-CM

## 2022-06-27 DIAGNOSIS — E119 Type 2 diabetes mellitus without complications: Secondary | ICD-10-CM | POA: Diagnosis not present

## 2022-06-27 DIAGNOSIS — Q438 Other specified congenital malformations of intestine: Secondary | ICD-10-CM | POA: Insufficient documentation

## 2022-06-27 DIAGNOSIS — R011 Cardiac murmur, unspecified: Secondary | ICD-10-CM | POA: Insufficient documentation

## 2022-06-27 DIAGNOSIS — N400 Enlarged prostate without lower urinary tract symptoms: Secondary | ICD-10-CM | POA: Diagnosis not present

## 2022-06-27 DIAGNOSIS — D120 Benign neoplasm of cecum: Secondary | ICD-10-CM | POA: Diagnosis not present

## 2022-06-27 HISTORY — PX: COLONOSCOPY WITH PROPOFOL: SHX5780

## 2022-06-27 HISTORY — PX: POLYPECTOMY: SHX149

## 2022-06-27 HISTORY — PX: SUBMUCOSAL TATTOO INJECTION: SHX6856

## 2022-06-27 HISTORY — PX: SUBMUCOSAL LIFTING INJECTION: SHX6855

## 2022-06-27 LAB — HM COLONOSCOPY

## 2022-06-27 LAB — GLUCOSE, CAPILLARY: Glucose-Capillary: 136 mg/dL — ABNORMAL HIGH (ref 70–99)

## 2022-06-27 SURGERY — COLONOSCOPY WITH PROPOFOL
Anesthesia: General

## 2022-06-27 MED ORDER — LACTATED RINGERS IV SOLN: INTRAVENOUS | Status: DC

## 2022-06-27 MED ORDER — PROPOFOL 10 MG/ML IV BOLUS
INTRAVENOUS | Status: DC | PRN
  Administered 2022-06-27: 100 mg via INTRAVENOUS
  Administered 2022-06-27 (×2): 50 mg via INTRAVENOUS

## 2022-06-27 MED ORDER — SPOT INK MARKER SYRINGE KIT
PACK | SUBMUCOSAL | Status: DC | PRN
  Administered 2022-06-27: 1 mL via SUBMUCOSAL

## 2022-06-27 MED ORDER — GLUCAGON HCL RDNA (DIAGNOSTIC) 1 MG IJ SOLR
INTRAMUSCULAR | Status: AC
  Filled 2022-06-27: qty 1

## 2022-06-27 MED ORDER — LIDOCAINE HCL (PF) 2 % IJ SOLN
INTRAMUSCULAR | Status: AC
  Filled 2022-06-27: qty 5

## 2022-06-27 MED ORDER — PROPOFOL 500 MG/50ML IV EMUL
INTRAVENOUS | Status: AC
  Filled 2022-06-27: qty 50

## 2022-06-27 MED ORDER — SPOT INK MARKER SYRINGE KIT
PACK | SUBMUCOSAL | Status: AC
  Filled 2022-06-27: qty 5

## 2022-06-27 MED ORDER — LIDOCAINE HCL (CARDIAC) PF 100 MG/5ML IV SOSY
PREFILLED_SYRINGE | INTRAVENOUS | Status: DC | PRN
  Administered 2022-06-27: 50 mg via INTRAVENOUS

## 2022-06-27 MED ORDER — PROPOFOL 500 MG/50ML IV EMUL
INTRAVENOUS | Status: DC | PRN
  Administered 2022-06-27: 150 ug/kg/min via INTRAVENOUS

## 2022-06-27 MED ORDER — STERILE WATER FOR IRRIGATION IR SOLN
Status: DC | PRN
  Administered 2022-06-27: 60 mL

## 2022-06-27 MED ORDER — SODIUM CHLORIDE FLUSH 0.9 % IV SOLN
INTRAVENOUS | Status: AC
  Filled 2022-06-27: qty 10

## 2022-06-27 NOTE — H&P (Addendum)
Larry Graves is an 77 y.o. male.   Chief Complaint: CRC screening HPI: 77 y/o M with PMH BPH, DM, HTN, history of alcoholism, coming for history of colon polyps.  Reports his last colonoscopy was performed 10 years ago and he had 1 polyp removed, no reports are available.  The patient denies having any complaints such as melena, hematochezia, abdominal pain or distention, change in her bowel movement consistency or frequency, no changes in weight recently.  No family history of colorectal cancer.   Past Medical History:  Diagnosis Date   BPH (benign prostatic hyperplasia)    Diabetes mellitus without complication    Heart murmur    History of alcoholism    Hypertension     Past Surgical History:  Procedure Laterality Date   APPENDECTOMY  03/12/1962   [facility] in [place], [place]   BACK SURGERY  03/07/2022   L4 S-1   CARPAL TUNNEL RELEASE Bilateral 11/10/1988   [facility] in [place], [place]   FOOT SURGERY Left 2010?   bunionectomy-[facility] in [place], [place]   HERNIA REPAIR  1610?   [facility] in [place], [place]   MELANOMA EXCISION     on face-all done in office    NASAL SEPTUM SURGERY  11/11/1978   [facility] in [place], [place]   QUADRICEPS TENDON REPAIR Right 03/10/2014   Procedure: REPAIR QUADRICEP TENDON;  Surgeon: Vickki Hearing, MD;  Location: [facility];  Service: Orthopedics;  Laterality: Right;   REPLACEMENT TOTAL KNEE Left 2000?   [facility] in [place], [place]    Family History  Problem Relation Age of Onset   Diabetes Mother    Heart murmur Mother    Cancer Brother    Social History:  reports that he has quit smoking. His smoking use included cigarettes. He has quit using smokeless tobacco. He reports that he does not drink alcohol and does not use drugs.  Allergies:  Allergies  Allergen Reactions   Neosporin [Bacitracin-Polymyxin B] Itching   Bacitracin Rash   Desonide Rash   Dicloxacillin Rash   Triamcinolone Rash    Medications  Prior to Admission  Medication Sig Dispense Refill   amLODipine (NORVASC) 5 MG tablet Take 5 mg by mouth at bedtime.     cholecalciferol (VITAMIN D) 1000 units tablet Take 1,000 Units by mouth daily.     finasteride (PROSCAR) 5 MG tablet Take 1 tablet (5 mg total) by mouth daily. 90 tablet 3   losartan-hydrochlorothiazide (HYZAAR) 100-25 MG tablet Take 1 tablet by mouth daily.     Multiple Vitamin (MULTIVITAMIN WITH MINERALS) TABS tablet Take 1 tablet by mouth daily.     Multiple Vitamins-Minerals (PRESERVISION AREDS 2 PO) Take 1 capsule by mouth in the morning and at bedtime.     pravastatin (PRAVACHOL) 10 MG tablet Take 10 mg by mouth daily.     sodium chloride 1 g tablet Take 1 tablet (1 g total) by mouth 3 (three) times daily for 2 days. 6 tablet 0   tamsulosin (FLOMAX) 0.4 MG CAPS capsule Take 1 capsule (0.4 mg total) by mouth daily. 90 capsule 3   traMADol (ULTRAM) 50 MG tablet Take 50 mg by mouth every 6 (six) hours as needed for moderate pain.     vitamin B-12 (CYANOCOBALAMIN) 1000 MCG tablet Take 1,000 mcg by mouth daily.     vitamin C (ASCORBIC ACID) 500 MG tablet Take 500 mg by mouth daily.     celecoxib (  CELEBREX) 100 MG capsule Take 100 mg by mouth daily.      Results for orders placed or performed during the hospital encounter of 06/27/22 (from the past 48 hour(s))  Glucose, capillary     Status: Abnormal   Collection Time: 06/27/22 10:58 AM  Result Value Ref Range   Glucose-Capillary 136 (H) 70 - 99 mg/dL    Comment: Glucose reference range applies only to samples taken after fasting for at least 8 hours.   No results found.  Review of Systems  All other systems reviewed and are negative.   Blood pressure (!) 165/81, pulse 100, temperature 98.1 F (36.7 C), temperature source Oral, resp. rate 13, SpO2 97 %. Physical Exam  GENERAL: The patient is AO x3, in no acute distress. HEENT: Head is normocephalic and atraumatic. EOMI are intact. Mouth is well hydrated and  without lesions. NECK: Supple. No masses LUNGS: Clear to auscultation. No presence of rhonchi/wheezing/rales. Adequate chest expansion HEART: RRR, normal s1 and s2. ABDOMEN: Soft, nontender, no guarding, no peritoneal signs, and nondistended. BS +. No masses. EXTREMITIES: Without any cyanosis, clubbing, rash, lesions or edema. NEUROLOGIC: AOx3, no focal motor deficit. SKIN: no jaundice, no rashes  Assessment/Plan  77 y/o M with PMH BPH, DM, HTN, history of alcoholism, coming for history of colon polyps.  Will proceed with colonoscopy.  Dolores Frame, MD 06/27/2022, 11:51 AM

## 2022-06-27 NOTE — Op Note (Addendum)
[facility] Patient Name: Larry Graves Procedure Date: 06/27/2022 12:26 PM MRN: [id number] Date of Birth: 06-Nov-1945 Attending MD: Katrinka Blazing , , [id number] CSN: [id number] Age: 77 Admit Type: Outpatient Procedure:                Colonoscopy Indications:              Surveillance: Personal history of colonic polyps                            (unknown histology) on last colonoscopy more than 5                            years ago Providers:                Katrinka Blazing, Crystal Page, Dyann Ruddle Referring MD:              Medicines:                Monitored Anesthesia Care Complications:            No immediate complications. Estimated Blood Loss:     Estimated blood loss: none. Procedure:                Pre-Anesthesia Assessment:                           - Prior to the procedure, a History and Physical                            was performed, and patient medications, allergies                            and sensitivities were reviewed. The patient's                            tolerance of previous anesthesia was reviewed.                           - The risks and benefits of the procedure and the                            sedation options and risks were discussed with the                            patient. All questions were answered and informed                            consent was obtained.                           - After reviewing the risks and benefits, the                            patient was deemed in satisfactory condition to                            undergo the procedure.                           -  ASA Grade Assessment: II - A patient with mild                            systemic disease.                           After obtaining informed consent, the colonoscope                            was passed under direct vision. Throughout the                            procedure, the patient's blood pressure, pulse, and                            oxygen  saturations were monitored continuously. The                            [id number]-HQ190L ([id number]) scope was introduced through                            the anus and advanced to the the cecum, identified                            by appendiceal orifice and ileocecal valve. The                            patient tolerated the procedure well. The                            colonoscopy was performed with difficulty due to                            significant looping. The quality of the bowel                            preparation was good. Scope In: 12:53:22 PM Scope Out: 2:00:26 PM Scope Withdrawal Time: 0 hours 53 minutes 15 seconds  Total Procedure Duration: 1 hour 7 minutes 4 seconds  Findings:      The perianal and digital rectal examinations were normal.      An 8 mm polyp was found in the cecum. The polyp was sessile. The polyp       was removed with a cold snare. Resection and retrieval were complete.      A 20 to 25 mm polyp was found in the distal ascending colon. The polyp       was carpet-like and flat. Area was successfully injected with 12 mL       Eleview for a lift polypectomy. Imaging was performed using white light       and narrow band imaging to visualize the mucosa and demarcate the polyp       site after injection for EMR purposes. The polyp was removed with a hot       snare. Polyp resection was incomplete as there was significant scarring       at the  base of the majority of the polyp, which did not allow an       adequate grasp of the tissue. The resected tissue was retrieved. Area       1cm distal was tattooed with an injection of 1 mL of Spot (carbon black).      A 14 mm polyp was found in the ascending colon. The polyp was       semi-sessile. The polyp was removed with a hot snare. Resection and       retrieval were complete.      Non-bleeding internal hemorrhoids were found during retroflexion. The       hemorrhoids were small. Impression:               - One 8  mm polyp in the cecum, removed with a cold                            snare. Resected and retrieved.                           - One 20 to 25 mm polyp in the distal ascending                            colon, removed with a hot snare. Incomplete                            resection via EMR. Resected tissue retrieved.                            Injected. Tattooed.                           - One 14 mm polyp in the ascending colon, removed                            with a hot snare. Resected and retrieved.                           - Non-bleeding internal hemorrhoids. Moderate Sedation:      Per Anesthesia Care Recommendation:           - Discharge patient to home (ambulatory).                           - Resume previous diet.                           - Await pathology results.                           - No Celebrex, ibuprofen, naproxen, or other                            non-steroidal anti-inflammatory drugs for 10 weeks                            after polyp removal. .                           -  Will discuss possible referral to general surgery                            for resection versus possible repeat colonoscopy by                            advanced endoscopy. Procedure Code(s):        --- Professional ---                           [id number], 59, Colonoscopy, flexible; with removal of                            tumor(s), polyp(s), or other lesion(s) by snare                            technique                           45381, Colonoscopy, flexible; with directed                            submucosal injection(s), any substance Diagnosis Code(s):        --- Professional ---                           Z86.010, Personal history of colonic polyps                           D12.0, Benign neoplasm of cecum                           D12.2, Benign neoplasm of ascending colon                           K64.8, Other hemorrhoids CPT copyright 2022 [employer]. All rights  reserved. The codes documented in this report are preliminary and upon coder review may  be revised to meet current compliance requirements. Katrinka Blazing, MD Katrinka Blazing,  06/27/2022 2:13:08 PM This report has been signed electronically. Number of Addenda: 0

## 2022-06-27 NOTE — Anesthesia Preprocedure Evaluation (Addendum)
Anesthesia Evaluation  Patient identified by MRN, date of birth, ID band Patient awake    Reviewed: Allergy & Precautions, H&P , NPO status , Patient's Chart, lab work & pertinent test results  Airway Mallampati: II  TM Distance: >3 FB Neck ROM: Full    Dental  (+) Dental Advisory Given, Upper Dentures, Lower Dentures   Pulmonary former smoker   Pulmonary exam normal breath sounds clear to auscultation       Cardiovascular Exercise Tolerance: Good METS: 3 - Mets hypertension, Pt. on medications + Valvular Problems/Murmurs  Rhythm:Regular Rate:Normal + Systolic murmurs Vesta Mixer, MD 05/03/2022  9:01 PM EST   LVEF is 65-70%. Grade I DD   Trivial MR, Trivial AI,  mild aortic stenosis Asc. Aorta is 38 mm. Borderline enlarged / upper limits of normal     Neuro/Psych  Neuromuscular disease  negative psych ROS   GI/Hepatic negative GI ROS,,,(+)     substance abuse  alcohol use  Endo/Other  diabetes (diet controlled), Well Controlled, Type 2    Renal/GU negative Renal ROS  negative genitourinary   Musculoskeletal negative musculoskeletal ROS (+)    Abdominal   Peds negative pediatric ROS (+)  Hematology negative hematology ROS (+)   Anesthesia Other Findings   Reproductive/Obstetrics negative OB ROS                             Anesthesia Physical Anesthesia Plan  ASA: 2  Anesthesia Plan: General   Post-op Pain Management: Minimal or no pain anticipated   Induction: Intravenous  PONV Risk Score and Plan: Propofol infusion  Airway Management Planned: Nasal Cannula and Natural Airway  Additional Equipment:   Intra-op Plan:   Post-operative Plan:   Informed Consent: I have reviewed the patients History and Physical, chart, labs and discussed the procedure including the risks, benefits and alternatives for the proposed anesthesia with the patient or authorized  representative who has indicated his/her understanding and acceptance.     Dental advisory given  Plan Discussed with: CRNA and Surgeon  Anesthesia Plan Comments:         Anesthesia Quick Evaluation

## 2022-06-27 NOTE — Anesthesia Procedure Notes (Addendum)
Date/Time: 06/27/2022 12:41 PM  Performed by: Ronny Bacon, RNPre-anesthesia Checklist: Patient identified, Emergency Drugs available, Suction available and Patient being monitored Oxygen Delivery Method: Nasal cannula Placement Confirmation: positive ETCO2 Dental Injury: Teeth and Oropharynx as per pre-operative assessment

## 2022-06-27 NOTE — Discharge Instructions (Addendum)
You are being discharged to home.  Resume your previous diet.  We are waiting for your pathology results.  No Celebrex, ibuprofen, naproxen, or other non-steroidal anti-inflammatory drugs for 10 days after polyp removal. .

## 2022-06-27 NOTE — Transfer of Care (Signed)
Immediate Anesthesia Transfer of Care Note  Patient: Larry Graves  Procedure(s) Performed: COLONOSCOPY WITH PROPOFOL SUBMUCOSAL LIFTING INJECTION POLYPECTOMY INTESTINAL SUBMUCOSAL TATTOO INJECTION  Patient Location: Short Stay  Anesthesia Type:General  Level of Consciousness: awake  Airway & Oxygen Therapy: Patient Spontanous Breathing  Post-op Assessment: Report given to RN  Post vital signs: Reviewed  Last Vitals:  Vitals Value Taken Time  BP    Temp    Pulse    Resp    SpO2      Last Pain:  Vitals:   06/27/22 1241  TempSrc:   PainSc: 0-No pain      Patients Stated Pain Goal: 5 (06/27/22 1114)  Complications: No notable events documented.

## 2022-06-27 NOTE — Anesthesia Postprocedure Evaluation (Signed)
Anesthesia Post Note  Patient: Larry Graves  Procedure(s) Performed: COLONOSCOPY WITH PROPOFOL SUBMUCOSAL LIFTING INJECTION POLYPECTOMY INTESTINAL SUBMUCOSAL TATTOO INJECTION  Patient location during evaluation: Short Stay Anesthesia Type: General Level of consciousness: awake and alert Pain management: pain level controlled Vital Signs Assessment: post-procedure vital signs reviewed and stable Respiratory status: spontaneous breathing Cardiovascular status: blood pressure returned to baseline and stable Postop Assessment: no apparent nausea or vomiting Anesthetic complications: no   No notable events documented.   Last Vitals:  Vitals:   06/27/22 1114 06/27/22 1409  BP: (!) 165/81 (!) 129/96  Pulse: 100 69  Resp: 13 16  Temp: 36.7 C 36.7 C  SpO2: 97% 99%    Last Pain:  Vitals:   06/27/22 1409  TempSrc: Oral  PainSc: 3                  Willadean Guyton

## 2022-06-27 NOTE — Progress Notes (Signed)
Discussed findings with patient, he will like to be referred to general surgery for partial colectomy.  Will wait for pathology report to come back.

## 2022-06-28 ENCOUNTER — Ambulatory Visit (HOSPITAL_COMMUNITY): Payer: Medicare Other | Admitting: Physical Therapy

## 2022-06-28 DIAGNOSIS — M48062 Spinal stenosis, lumbar region with neurogenic claudication: Secondary | ICD-10-CM

## 2022-06-28 DIAGNOSIS — M5459 Other low back pain: Secondary | ICD-10-CM

## 2022-06-28 LAB — SURGICAL PATHOLOGY

## 2022-06-28 NOTE — Therapy (Signed)
OUTPATIENT PHYSICAL THERAPY THORACOLUMBAR EVALUATION   Patient Name: Larry Graves MRN: [id number] DOB:08/24/1945, 77 y.o., male Today's Date: 06/28/2022  END OF SESSION:  PT End of Session - 06/28/22 1028    Visit Number 2    Number of Visits 8    Date for PT Re-Evaluation 07/20/22    Authorization Type [employer] Medicare    PT Start Time [phone]    PT Stop Time 1026    PT Time Calculation (min) 38 min    Activity Tolerance Patient tolerated treatment well    Behavior During Therapy WFL for tasks assessed/performed             Past Medical History:  Diagnosis Date   BPH (benign prostatic hyperplasia)    Diabetes mellitus without complication    Heart murmur    History of alcoholism    Hypertension    Past Surgical History:  Procedure Laterality Date   APPENDECTOMY  03/12/1962   [facility] in [place], [place]   BACK SURGERY  03/07/2022   L4 S-1   CARPAL TUNNEL RELEASE Bilateral 11/10/1988   [facility] in [place], [place]   FOOT SURGERY Left 2010?   bunionectomy-[facility] in [place], [place]   HERNIA REPAIR  1191?   [facility] in [place], [place]   MELANOMA EXCISION     on face-all done in office    NASAL SEPTUM SURGERY  11/11/1978   [facility] [place], [place]   QUADRICEPS TENDON REPAIR Right 03/10/2014   Procedure: REPAIR QUADRICEP TENDON;  Surgeon: Vickki Hearing, MD;  Location: [facility];  Service: Orthopedics;  Laterality: Right;   REPLACEMENT TOTAL KNEE Left 2000?   [facility] in [place], [place]   Patient Active Problem List   Diagnosis Date Noted   History of colonic polyps 06/27/2022   Hematospermia 04/17/2019   Urgency of urination 04/17/2019   BPH with urinary obstruction 04/17/2019   Quadriceps muscle rupture     PCP: Benita Stabile., MD  REFERRING PROVIDER: Patricia Nettle, MD  REFERRING DIAG: spinal stenosis, lumbar region w/ neurogenic claudication  Rationale for Evaluation and Treatment: Rehabilitation  THERAPY DIAG:  Other  low back pain  Spinal stenosis of lumbar region with neurogenic claudication  ONSET DATE: 03/07/22                                                                                                                                                                                        SUBJECTIVE STATEMENT:  Pt states he had a colonoscopy yesterday so he did not have a lot of time to do his exercises.  He is feeling pretty good today.    PERTINENT HISTORY:  L4-S1 Laminectomy and fusion with TLIF, instrumentation, allograft (03/07/22), L TKR  PAIN:  Are you having pain? Yes: NPRS scale: 1/10 Pain location: across the back Pain description: aching, intermittent Aggravating factors: bending over Relieving factors: pain medications  PRECAUTIONS: Other: not to lift > 50 lbs from the floor  WEIGHT BEARING RESTRICTIONS: No  FALLS:  Has patient fallen in last 6 months? No  LIVING ENVIRONMENT: Lives with: lives with their spouse Lives in: House/apartment Stairs: Yes: External: 5 steps; bilateral but cannot reach both Has following equipment at home: Dan Humphreys - 2 wheeled  OCCUPATION: retired  PLOF: Independent and Independent with basic ADLs  PATIENT GOALS: "to get my movement back"  NEXT MD VISIT: September 2024  OBJECTIVE:   DIAGNOSTIC FINDINGS:  EXAM: 12/20/2020 MRI LUMBAR SPINE WITHOUT CONTRAST   IMPRESSION: 1. Multilevel lumbar spondylosis greatest at the L4-5 level where there is moderate-to-severe canal stenosis and severe bilateral foraminal stenosis. 2. Severe bilateral foraminal stenosis at L5-S1. 3. Mild canal stenosis at L2-L3.  PATIENT SURVEYS:  FOTO 53   MUSCLE LENGTH: Moderate restriction on B hamstrings, and piriformis Mild restriction on B hip flexors  POSTURE: rounded shoulders, forward head, and decreased lumbar lordosis   LUMBAR ROM:   AROM eval  Flexion 75%  Extension 25%  Right lateral flexion   Left lateral flexion   Right rotation    Left rotation    (Blank rows = not tested)  LOWER EXTREMITY ROM:     Active  Right eval Left eval  Hip flexion [facility]  Hip extension [facility]  Hip abduction [facility]  Hip adduction    Hip internal rotation    Hip external rotation    Knee flexion [facility] WFL  Knee extension [facility] WFL  Ankle dorsiflexion    Ankle plantarflexion    Ankle inversion    Ankle eversion     (Blank rows = not tested)  LOWER EXTREMITY MMT:    MMT Right eval Left eval  Hip flexion 4 4  Hip extension 3+ 3+  Hip abduction 3+ 3+  Hip adduction    Hip internal rotation    Hip external rotation    Knee flexion 5 5  Knee extension 5 5  Ankle dorsiflexion 5 5  Ankle plantarflexion 5 5  Ankle inversion    Ankle eversion     (Blank rows = not tested)  FUNCTIONAL TESTS:  5 times sit to stand: 7.63 sec 2 minute walk test: 467 ft  GAIT: Distance walked: 467 ft Assistive device utilized: None Level of assistance: Complete Independence Comments: Trendelenburg gait  TODAY'S TREATMENT:                                                                                                                              DATE:  06/28/22 Sitting: Scapular retraction x 10  Tall sitting x 10 Abdominal set  x 10 Piriformis stretch 3 x 30" Supine:  Abdominal set x 10 Knee to chest 3 x 30" B Hamstring stretch 3 x 30" B Bent knee raise x 10 Bridge x 10   06/22/22 Evaluation and patient education Seated hamstring stretch x 30" x 3 Seated piriformis stretch x 30" x 3  PATIENT EDUCATION:  Education details: Educated on the pathoanatomy of low back pain. Educated on the goals and course of rehab. Written HEP provided and reviewed Person educated: Patient Education method: Explanation, Demonstration, Verbal cues, and Handouts Education comprehension: verbalized understanding and returned demonstration  HOME EXERCISE PROGRAM:            4/18            Access Code: [email or web address] URL:  [email or web address] Date: 06/28/2022 Prepared by: Virgina Organ  Exercises - Correct Seated Posture  - 3 x daily - 7 x weekly - 1 sets - 10 reps - 5 hold - Seated Transversus Abdominis Bracing  - 3 x daily - 7 x weekly - 1 sets - 10 reps - 5 hold - Seated Scapular Retraction  - 3 x daily - 7 x weekly - 1 sets - 10 reps - 5 hold - Supine Transversus Abdominis Bracing - Hands on Stomach  - 2 x daily - 7 x weekly - 1 sets - 10 reps - 5 hold - Supine Single Knee to Chest Stretch  - 2 x daily - 7 x weekly - 1 sets - 3 reps - 30" hold - Hooklying Active Hamstring Stretch  - 2 x daily - 7 x weekly - 1 sets - 3 reps Access Code: [email or web address] URL: [email or web address] Date: 06/22/2022 Prepared by: Krystal Clark  Exercises - Seated Hamstring Stretch  - 2 x daily - 7 x weekly - 3 reps - 30 hold - Seated Piriformis Stretch  - 2 x daily - 7 x weekly - 3 reps - 30 hold  ASSESSMENT:  CLINICAL IMPRESSION: PT evaluation and goals reviewed with patient.  Pt instructed pt in new postural and stretching exercises as well as  updated  PT HEP.continues to have  difficulty bending, carrying, lifting due to pain, weakness, and decreased soft tissue extensibility. Skilled PT is required to address the impairments and functional limitations listed below.   OBJECTIVE IMPAIRMENTS: Abnormal gait, decreased activity tolerance, decreased ROM, decreased strength, impaired flexibility, and pain.   ACTIVITY LIMITATIONS: carrying, lifting, and bending  PARTICIPATION LIMITATIONS: meal prep, cleaning, laundry, community activity, and yard work  PERSONAL FACTORS: Age and Time since onset of injury/illness/exacerbation are also affecting patient's functional outcome.   REHAB POTENTIAL: Good  CLINICAL DECISION MAKING: Stable/uncomplicated  EVALUATION COMPLEXITY: Low   GOALS: Goals reviewed with patient? Yes  SHORT TERM GOALS: Target date: 07/06/2022  Pt will demonstrate indep in  HEP to facilitate carry-over of skilled services and improve functional outcomes  LONG TERM GOALS: Target date: 07/20/2022  Pt will increase FOTO to at least 60 in order to demonstrate significant improvement in function related to bending over and ADLs Baseline: 53 Goal status: IN PROGRESS  2.  Pt will increase by at least 40 ft in order to demonstrate clinically significant improvement in community ambulation  Baseline: 467 ft Goal status: IN PROGRESS  3.  Pt will demonstrate increase in lumbar flex ROM by 25%  to facilitate ease in ADLs  Baseline: 75% Goal status: IN PROGRESS  4.  Pt will demonstrate increase in LE strength to 4+/5  to facilitate ease and safety  in ambulation Baseline: 3+/5 Goal status: IN PROGRESS  PLAN:  PT FREQUENCY: 2x/week  PT DURATION: 4 weeks  PLANNED INTERVENTIONS: Therapeutic exercises, Therapeutic activity, Neuromuscular re-education, Balance training, Gait training, Patient/Family education, Self Care, and Manual therapy.  PLAN FOR NEXT SESSION: May begin LE flexibility, core and hip strengthening via stabilization exercises. Update HEP as needed.  Virgina Organ, PT CLT [phone]  [phone]

## 2022-06-29 ENCOUNTER — Encounter (INDEPENDENT_AMBULATORY_CARE_PROVIDER_SITE_OTHER): Payer: Self-pay | Admitting: *Deleted

## 2022-07-02 ENCOUNTER — Encounter (HOSPITAL_COMMUNITY): Payer: Self-pay | Admitting: Gastroenterology

## 2022-07-03 ENCOUNTER — Ambulatory Visit (INDEPENDENT_AMBULATORY_CARE_PROVIDER_SITE_OTHER): Payer: Medicare Other | Admitting: General Surgery

## 2022-07-03 ENCOUNTER — Encounter: Payer: Self-pay | Admitting: General Surgery

## 2022-07-03 VITALS — BP 167/98 | HR 102 | Temp 98.1°F | Resp 14 | Ht 63.0 in | Wt 173.0 lb

## 2022-07-03 DIAGNOSIS — D126 Benign neoplasm of colon, unspecified: Secondary | ICD-10-CM | POA: Diagnosis not present

## 2022-07-03 NOTE — Progress Notes (Signed)
Larry Graves; [id number]; 1946-01-16   HPI Patient is a 77 year old white male who is referred to my care by Dr. Levon Hedger of GI for evaluation and treatment of a tubular adenoma in the proximal descending colon.  He was undergoing a routine follow-up colonoscopy and had multiple polyps removed.  In the proximal descending colon, the polyp was removed piecemeal and could not be raised effectively due to scar tissue.  Final pathology reveals a tubular adenoma with no evidence of dysplasia or malignancy.  Patient denies any family history of colon cancer.  He is otherwise without complaint. Past Medical History:  Diagnosis Date   BPH (benign prostatic hyperplasia)    Diabetes mellitus without complication    Heart murmur    History of alcoholism    Hypertension     Past Surgical History:  Procedure Laterality Date   APPENDECTOMY  03/12/1962   [facility] in [place], [place]   BACK SURGERY  03/07/2022   L4 S-1   CARPAL TUNNEL RELEASE Bilateral 11/10/1988   [facility] in [place], [place]   COLONOSCOPY WITH PROPOFOL N/A 06/27/2022   Procedure: COLONOSCOPY WITH PROPOFOL;  Surgeon: Dolores Frame, MD;  [facility];  Service: Gastroenterology;  Laterality: N/A;  1:15pm;ASA 3   FOOT SURGERY Left 2010?   bunionectomy-[facility] in [place], [place]   HERNIA REPAIR  [phone]?   [facility] in [place], [place]   MELANOMA EXCISION     on face-all done in office    NASAL SEPTUM SURGERY  11/11/1978   [facility] [place], [place]   POLYPECTOMY  06/27/2022   Procedure: POLYPECTOMY INTESTINAL;  Surgeon: Dolores Frame, MD;  Location[facility];  Service: Gastroenterology;;   QUADRICEPS TENDON REPAIR Right 03/10/2014   Procedure: REPAIR QUADRICEP TENDON;  Surgeon: Vickki Hearing, MD;  Location: [facility];  Service: Orthopedics;  Laterality: Right;   REPLACEMENT TOTAL KNEE Left 2000?   [facility] in [place], [place]   SUBMUCOSAL LIFTING INJECTION   06/27/2022   Procedure: SUBMUCOSAL LIFTING INJECTION;  Surgeon: Dolores Frame, MD;  [facility];  Service: Gastroenterology;;   SUBMUCOSAL TATTOO INJECTION  06/27/2022   Procedure: SUBMUCOSAL TATTOO INJECTION;  Surgeon: Marguerita Merles, Reuel Boom, MD;  Location: [facility];  Service: Gastroenterology;;    Family History  Problem Relation Age of Onset   Diabetes Mother    Heart murmur Mother    Cancer Brother     Current Outpatient Medications on File Prior to Visit  Medication Sig Dispense Refill   amLODipine (NORVASC) 5 MG tablet Take 5 mg by mouth at bedtime.     cholecalciferol (VITAMIN D) 1000 units tablet Take 1,000 Units by mouth daily.     finasteride (PROSCAR) 5 MG tablet Take 1 tablet (5 mg total) by mouth daily. 90 tablet 3   losartan-hydrochlorothiazide (HYZAAR) 100-25 MG tablet Take 1 tablet by mouth daily.     Multiple Vitamin (MULTIVITAMIN WITH MINERALS) TABS tablet Take 1 tablet by mouth daily.     Multiple Vitamins-Minerals (PRESERVISION AREDS 2 PO) Take 1 capsule by mouth in the morning and at bedtime.     pravastatin (PRAVACHOL) 10 MG tablet Take 10 mg by mouth daily.     tamsulosin (FLOMAX) 0.4 MG CAPS capsule Take 1 capsule (0.4 mg total) by mouth daily. 90 capsule 3   traMADol (ULTRAM) 50 MG tablet Take 50 mg by mouth every 6 (six) hours as needed for moderate pain.  vitamin B-12 (CYANOCOBALAMIN) 1000 MCG tablet Take 1,000 mcg by mouth daily.     vitamin C (ASCORBIC ACID) 500 MG tablet Take 500 mg by mouth daily.     celecoxib (CELEBREX) 100 MG capsule Take 100 mg by mouth daily. (Patient not taking: Reported on 07/03/2022)     No current facility-administered medications on file prior to visit.    Allergies  Allergen Reactions   Neosporin [Bacitracin-Polymyxin B] Itching   Bacitracin Rash   Desonide Rash   Dicloxacillin Rash   Triamcinolone Rash    Social History   Substance and Sexual Activity  Alcohol Use No   Comment:  prior alcoholic - stopped 25 years as of 03/08/2014    Social History   Tobacco Use  Smoking Status Former   Years: 5   Types: Cigarettes  Smokeless Tobacco Former  Tobacco Comments   hx of 1 pack per 2 weeks    Review of Systems  Constitutional: Negative.   HENT: Negative.    Eyes: Negative.   Respiratory: Negative.    Cardiovascular: Negative.   Gastrointestinal: Negative.   Genitourinary: Negative.   Musculoskeletal:  Positive for back pain, joint pain and neck pain.  Skin: Negative.   Neurological: Negative.   Endo/Heme/Allergies: Negative.   Psychiatric/Behavioral: Negative.      Objective   Vitals:   07/03/22 1057  BP: (!) 167/98  Pulse: (!) 102  Resp: 14  Temp: 98.1 F (36.7 C)  SpO2: 95%    Physical Exam Vitals reviewed.  Constitutional:      Appearance: Normal appearance. He is not ill-appearing.  HENT:     Head: Normocephalic and atraumatic.  Cardiovascular:     Rate and Rhythm: Normal rate and regular rhythm.     Heart sounds: Murmur heard.     No gallop.     Comments: 3 out of 6 systolic ejection murmur appreciated. Pulmonary:     Effort: Pulmonary effort is normal. No respiratory distress.     Breath sounds: Normal breath sounds. No stridor. No wheezing, rhonchi or rales.  Abdominal:     General: Bowel sounds are normal. There is no distension.     Palpations: Abdomen is soft. There is no mass.     Tenderness: There is no abdominal tenderness. There is no guarding.     Hernia: No hernia is present.  Skin:    General: Skin is warm and dry.  Neurological:     Mental Status: He is alert and oriented to person, place, and time.    Colonoscopy and pathology reports reviewed.  Discussed with Dr. Levon Hedger. Assessment  Tubular adenoma, descending colon, incompletely excised Plan  Given the pathology results, I do not feel a partial colectomy is warranted at this time.  This is an accessible lesion and would recommend surveillance colonoscopy  in the next 3 to 6 months.  Dr. Levon Hedger is aware.  Should he develop dysplasia or malignant findings on follow-up colonoscopy, colectomy would be indicated.  Patient and wife are fully aware of this plan and agree.  They have been told I cannot give them a 100% guarantee that there is not a cancer present.  Follow-up with me expectantly.

## 2022-07-05 ENCOUNTER — Ambulatory Visit (HOSPITAL_COMMUNITY): Payer: Medicare Other

## 2022-07-05 ENCOUNTER — Encounter (HOSPITAL_COMMUNITY): Payer: Self-pay

## 2022-07-05 DIAGNOSIS — M48062 Spinal stenosis, lumbar region with neurogenic claudication: Secondary | ICD-10-CM

## 2022-07-05 DIAGNOSIS — M5459 Other low back pain: Secondary | ICD-10-CM | POA: Diagnosis not present

## 2022-07-05 NOTE — Therapy (Signed)
OUTPATIENT PHYSICAL THERAPY THORACOLUMBAR EVALUATION   Patient Name: Larry Graves MRN: [id number] DOB:[birth year], 77 y.o., male Today's Date: 07/05/2022  END OF SESSION: END OF SESSION:   PT End of Session - 07/05/22 0948     Visit Number 3    Number of Visits 8    Date for PT Re-Evaluation 07/20/22    Authorization Type [employer] Medicare    PT Start Time 0900    PT Stop Time 0941    PT Time Calculation (min) 41 min    Activity Tolerance Patient tolerated treatment well    Behavior During Therapy [facility] for tasks assessed/performed              Past Medical History:  Diagnosis Date   BPH (benign prostatic hyperplasia)    Diabetes mellitus without complication    Heart murmur    History of alcoholism    Hypertension    Past Surgical History:  Procedure Laterality Date   APPENDECTOMY  03/12/1962   [facility] in [place], [place]   BACK SURGERY  03/07/2022   L4 S-1   CARPAL TUNNEL RELEASE Bilateral 11/10/1988   [facility] in [place], [place]   COLONOSCOPY WITH PROPOFOL N/A 06/27/2022   Procedure: COLONOSCOPY WITH PROPOFOL;  Surgeon: Dolores Frame, MD;  [facility];  Service: Gastroenterology;  Laterality: N/A;  1:15pm;ASA 3   FOOT SURGERY Left 2010?   [facility]unionectomy-[facility] in [place], [place]   HERNIA REPAIR  [phone]?   [facility] in [place], [place]   MELANOMA EXCISION     on face-all done in office    NASAL SEPTUM SURGERY  11/11/1978   [facility] in [place], [place]   POLYPECTOMY  06/27/2022   Procedure: POLYPECTOMY INTESTINAL;  Surgeon: Dolores Frame, MD;  [facility];  Service: Gastroenterology;;   QUADRICEPS TENDON REPAIR Right 03/10/2014   Procedure: REPAIR QUADRICEP TENDON;  Surgeon: Vickki Hearing, MD;  Location: [facility];  Service: Orthopedics;  Laterality: Right;   REPLACEMENT TOTAL KNEE Left 2000?   [facility] in [place], [place]   SUBMUCOSAL LIFTING INJECTION  06/27/2022   Procedure: SUBMUCOSAL  LIFTING INJECTION;  Surgeon: Dolores Frame, MD;  [facility];  Service: Gastroenterology;;   SUBMUCOSAL TATTOO INJECTION  06/27/2022   Procedure: SUBMUCOSAL TATTOO INJECTION;  Surgeon: Dolores Frame, MD;  [facility];  Service: Gastroenterology;;   Patient Active Problem List   Diagnosis Date Noted   History of colonic polyps 06/27/2022   Hematospermia 04/17/2019   Urgency of urination 04/17/2019   BPH with urinary obstruction 04/17/2019   Quadriceps muscle rupture     PCP: Benita Stabile., MD  REFERRING PROVIDER: Patricia Nettle, MD  REFERRING DIAG: spinal stenosis, lumbar region w/ neurogenic claudication  Rationale for Evaluation and Treatment: Rehabilitation  THERAPY DIAG:  Other low back pain  Spinal stenosis of lumbar region with neurogenic claudication  ONSET DATE: 03/07/22  SUBJECTIVE STATEMENT:  Pt stated he is stiff today, more arthritic achey pain all over.  Had colonscopy and unable to take medication until 07/07/22.  HEP exercises are going well     PERTINENT HISTORY:  L4-S1 Laminectomy and fusion with TLIF, instrumentation, allograft (03/07/22), L TKR  PAIN:  Are you having pain? 07/04/22:No Yes: NPRS scale: 1/10 Pain location: across the back Pain description: aching, intermittent Aggravating factors: bending over Relieving factors: pain medications  PRECAUTIONS: Other: not to lift > 50 lbs from the floor  WEIGHT BEARING RESTRICTIONS: No  FALLS:  Has patient fallen in last 6 months? No  LIVING ENVIRONMENT: Lives with: lives with their spouse Lives in: House/apartment Stairs: Yes: External: 5 steps; bilateral but cannot reach both Has following equipment at home: Dan Humphreys - 2 wheeled  OCCUPATION: retired  PLOF: Independent and  Independent with basic ADLs  PATIENT GOALS: "to get my movement back"  NEXT MD VISIT: September 2024  OBJECTIVE:   DIAGNOSTIC FINDINGS:  EXAM: 12/20/2020 MRI LUMBAR SPINE WITHOUT CONTRAST   IMPRESSION: 1. Multilevel lumbar spondylosis greatest at the L4-5 level where there is moderate-to-severe canal stenosis and severe bilateral foraminal stenosis. 2. Severe bilateral foraminal stenosis at L5-S1. 3. Mild canal stenosis at L2-L3.  PATIENT SURVEYS:  FOTO 53   MUSCLE LENGTH: Moderate restriction on B hamstrings, and piriformis Mild restriction on B hip flexors  POSTURE: rounded shoulders, forward head, and decreased lumbar lordosis   LUMBAR ROM:   AROM eval  Flexion 75%  Extension 25%  Right lateral flexion   Left lateral flexion   Right rotation   Left rotation    (Blank rows = not tested)  LOWER EXTREMITY ROM:     Active  Right eval Left eval  Hip flexion [facility]  Hip extension [facility]  Hip abduction [facility]  Hip adduction    Hip internal rotation    Hip external rotation    Knee flexion [facility] WFL  Knee extension [facility] WFL  Ankle dorsiflexion    Ankle plantarflexion    Ankle inversion    Ankle eversion     (Blank rows = not tested)  LOWER EXTREMITY MMT:    MMT Right eval Left eval  Hip flexion 4 4  Hip extension 3+ 3+  Hip abduction 3+ 3+  Hip adduction    Hip internal rotation    Hip external rotation    Knee flexion 5 5  Knee extension 5 5  Ankle dorsiflexion 5 5  Ankle plantarflexion 5 5  Ankle inversion    Ankle eversion     (Blank rows = not tested)  FUNCTIONAL TESTS:  5 times sit to stand: 7.63 sec 2 minute walk test: 467 ft  GAIT: Distance walked: 467 ft Assistive device utilized: None Level of assistance: Complete Independence Comments: Trendelenburg gait  TODAY'S TREATMENT:  DATE:  07/04/22 3D hip  excursion (weight shifting, rotation, squat then lumbar extension) 10x  Prone: Prone deep breathing POE x 2 min Hip extension 10x  Sidelying: Clam with GTB 10x 5"  Supine:   Bridge 10x Bent knee raise with ab set x 10 Hamstring stretch 2x 30"   06/28/22 Sitting: Scapular retraction x 10  Tall sitting x 10 Abdominal set  x 10 Piriformis stretch 3 x 30" Supine:  Abdominal set x 10 Knee to chest 3 x 30" B Hamstring stretch 3 x 30" B Bent knee raise x 10 Bridge x 10   06/22/22 Evaluation and patient education Seated hamstring stretch x 30" x 3 Seated piriformis stretch x 30" x 3  PATIENT EDUCATION:  Education details: Educated on the pathoanatomy of low back pain. Educated on the goals and course of rehab. Written HEP provided and reviewed Person educated: Patient Education method: Explanation, Demonstration, Verbal cues, and Handouts Education comprehension: verbalized understanding and returned demonstration  HOME EXERCISE PROGRAM: 07/04/22: - Supine Bridge  - 1 x daily - 7 x weekly - 3 sets - 10 reps - 5" hold - Prone Press Up On Elbows  - 1 x daily - 7 x weekly - 3 sets - 3 reps - 2' hold - Clam with Resistance  - 1 x daily - 7 x weekly - 3 sets - 10 reps - 5" hold            4/18            Access Code: [email or web address] URL: [email or web address] Date: 06/28/2022 Prepared by: Virgina Organ  Exercises - Correct Seated Posture  - 3 x daily - 7 x weekly - 1 sets - 10 reps - 5 hold - Seated Transversus Abdominis Bracing  - 3 x daily - 7 x weekly - 1 sets - 10 reps - 5 hold - Seated Scapular Retraction  - 3 x daily - 7 x weekly - 1 sets - 10 reps - 5 hold - Supine Transversus Abdominis Bracing - Hands on Stomach  - 2 x daily - 7 x weekly - 1 sets - 10 reps - 5 hold - Supine Single Knee to Chest Stretch  - 2 x daily - 7 x weekly - 1 sets - 3 reps - 30" hold - Hooklying Active Hamstring Stretch  - 2 x daily - 7 x weekly - 1 sets - 3 reps Access Code:  [email or web address] URL: [email or web address] Date: 06/22/2022 Prepared by: Krystal Clark  Exercises - Seated Hamstring Stretch  - 2 x daily - 7 x weekly - 3 reps - 30 hold - Seated Piriformis Stretch  - 2 x daily - 7 x weekly - 3 reps - 30 hold  ASSESSMENT:  CLINICAL IMPRESSION: Added mobility and functional strengthening exercises which were tolerated well.  Additional gluteal exercises added with cueing for mechanics and noted difficulty with full range due to weakness in prone exercise.  No reports of pain through session.  Added to HEP for mobility and strengthening.  Delayed printer, give copy of HEP printout next session.   OBJECTIVE IMPAIRMENTS: Abnormal gait, decreased activity tolerance, decreased ROM, decreased strength, impaired flexibility, and pain.   ACTIVITY LIMITATIONS: carrying, lifting, and bending  PARTICIPATION LIMITATIONS: meal prep, cleaning, laundry, community activity, and yard work  PERSONAL FACTORS: Age and Time since onset of injury/illness/exacerbation are also affecting patient's functional outcome.   REHAB POTENTIAL: Good  CLINICAL DECISION MAKING: Stable/uncomplicated  EVALUATION COMPLEXITY: Low   GOALS: Goals reviewed  with patient? Yes  SHORT TERM GOALS: Target date: 07/06/2022  Pt will demonstrate indep in HEP to facilitate carry-over of skilled services and improve functional outcomes  LONG TERM GOALS: Target date: 07/20/2022  Pt will increase FOTO to at least 60 in order to demonstrate significant improvement in function related to bending over and ADLs Baseline: 53 Goal status: IN PROGRESS  2.  Pt will increase by at least 40 ft in order to demonstrate clinically significant improvement in community ambulation  Baseline: 467 ft Goal status: IN PROGRESS  3.  Pt will demonstrate increase in lumbar flex ROM by 25%  to facilitate ease in ADLs  Baseline: 75% Goal status: IN PROGRESS  4.  Pt will demonstrate increase in LE  strength to 4+/5  to facilitate ease and safety in ambulation Baseline: 3+/5 Goal status: IN PROGRESS  PLAN:  PT FREQUENCY: 2x/week  PT DURATION: 4 weeks  PLANNED INTERVENTIONS: Therapeutic exercises, Therapeutic activity, Neuromuscular re-education, Balance training, Gait training, Patient/Family education, Self Care, and Manual therapy.  PLAN FOR NEXT SESSION: May begin LE flexibility, core and hip strengthening via stabilization exercises. Update HEP as needed.  Becky Sax, LPTA/CLT; [place] [phone]

## 2022-07-09 ENCOUNTER — Encounter (HOSPITAL_COMMUNITY): Payer: Medicare Other | Admitting: Physical Therapy

## 2022-07-10 ENCOUNTER — Telehealth: Payer: Self-pay

## 2022-07-10 ENCOUNTER — Telehealth (INDEPENDENT_AMBULATORY_CARE_PROVIDER_SITE_OTHER): Payer: Self-pay | Admitting: Gastroenterology

## 2022-07-10 NOTE — Telephone Encounter (Signed)
I spoke with the pt and offered to make an appt to see GM in person or via phone. He tells me that he is leaving in the morning for [place] and will not be back until October.  He will not be able to have a video visit or come in prior to October per pt.  I have entered a recall for October colon EMR  FYI Dr Meridee Score

## 2022-07-10 NOTE — Telephone Encounter (Signed)
-----   Message from Lemar Lofty., MD sent at 07/10/2022  2:25 PM EDT ----- DCM, I'll be happy to try and help him.  Kao Conry, Please schedule this patient a clinic visit (can be video) at the end of a session if necessary since he is leaving town this May, if I have no slots available. Tentative Colonoscopy with EMR for October. Thanks. GM ----- Message ----- From: Dolores Frame, MD Sent: 07/10/2022   1:50 PM EDT To: Lemar Lofty., MD  Yes, that's correct.  He will go to the [place] area until that time  and will be back for the fall/winter time to [place]. Thanks ----- Message ----- From: Lemar Lofty., MD Sent: 07/10/2022  12:35 PM EDT To: Dolores Frame, MD  DCM, Just want to clarify that he will be away until October? GM ----- Message ----- From: Dolores Frame, MD Sent: 07/06/2022   1:22 PM EDT To: Lemar Lofty., MD  [facility] all is good. Went to reach you about this patient.  I recently did a colonoscopy for him.  I found a very flat polyp in the proximal ascending colon, which had very scarred base that did not lift despite injecting a large amount of [place].  I removed the lateral part of it but was not able to get a good grasp of roughly 2/3 of the polyp (polyp measure roughly 20 mm).  He initially wanted to have this surgically resected and saw general surgery at [facility] but upon further discussion he was amenable to have a repeat attempt to have this removed endoscopically.  Wanted to check if you could see him and attempt removing it endoscopically. He is going to [place] in early May and will be back until October.  He is okay waiting until that time to see you for removal.  Please let me know your thoughts.  Take care, Reuel Boom

## 2022-07-10 NOTE — Telephone Encounter (Signed)
Patty, Thanks for reaching out. Certainly things can change in regards to the size of these lesions or the risk of becoming more invasive, but I think that will be less likely. If he does not want to have a video visit until October, then we will see him in October and hopefully be able to get his procedure in before the end of the year. We will see. Thanks. GM  FYI DCM

## 2022-07-10 NOTE — Telephone Encounter (Signed)
I discussed with the patient the possible approaches to proceed with a repeat colonoscopy with Dr. Meridee Score to try to remove the ascending colon polyp.  Both the patient and Dr. Meridee Score are agreeable to proceed with this.  The patient is leaving [place] for a few months and will be back in October.  At that time he will need to have a repeat colonoscopy.

## 2022-07-10 NOTE — Telephone Encounter (Signed)
Thanks everyone for your help. I'll keep him on my radar.

## 2022-07-11 ENCOUNTER — Encounter (HOSPITAL_COMMUNITY): Payer: Medicare Other | Admitting: Physical Therapy

## 2022-07-16 ENCOUNTER — Encounter (HOSPITAL_COMMUNITY): Payer: Medicare Other | Admitting: Physical Therapy

## 2022-07-18 ENCOUNTER — Encounter (HOSPITAL_COMMUNITY): Payer: Medicare Other

## 2022-07-23 ENCOUNTER — Encounter (HOSPITAL_COMMUNITY): Payer: Medicare Other | Admitting: Physical Therapy

## 2022-07-25 ENCOUNTER — Encounter (HOSPITAL_COMMUNITY): Payer: Medicare Other

## 2022-07-30 ENCOUNTER — Encounter (HOSPITAL_COMMUNITY): Payer: Medicare Other | Admitting: Physical Therapy

## 2022-08-01 ENCOUNTER — Encounter (HOSPITAL_COMMUNITY): Payer: Medicare Other

## 2022-08-07 ENCOUNTER — Encounter (HOSPITAL_COMMUNITY): Payer: Medicare Other | Admitting: Physical Therapy

## 2022-08-09 ENCOUNTER — Encounter (HOSPITAL_COMMUNITY): Payer: Medicare Other | Admitting: Physical Therapy

## 2022-08-13 ENCOUNTER — Encounter (HOSPITAL_COMMUNITY): Payer: Medicare Other | Admitting: Physical Therapy

## 2022-08-15 ENCOUNTER — Encounter (HOSPITAL_COMMUNITY): Payer: Medicare Other

## 2022-08-31 ENCOUNTER — Encounter (HOSPITAL_COMMUNITY): Payer: Self-pay

## 2022-08-31 NOTE — Therapy (Signed)
[facility] at [facility] [address] [place], [place], [address] Phone: [phone]   Fax:  [phone]  Patient Details  Name: Larry Graves MRN: [id number] Date of Birth: Jul 08, 1945 Referring Provider:  No ref. provider found  Encounter Date: 08/31/2022  PHYSICAL THERAPY DISCHARGE SUMMARY  Visits from Start of Care: 3  Current functional level related to goals / functional outcomes: Tolerated progression in ROM and strengthening interventions.    Remaining deficits: Limited ROM due to weakness.    Education / Equipment: HEP   Patient agrees to discharge. Patient goals were not met. Patient is being discharged due to not returning since the last visit.   Nelida Meuse, PT 08/31/2022, 2:19 PM  [facility] Outpatient Rehabilitation at [facility] [address] [place], [place], [address] Phone: [phone]   Fax:  [phone]

## 2022-12-24 ENCOUNTER — Ambulatory Visit (HOSPITAL_COMMUNITY)
Admission: RE | Admit: 2022-12-24 | Discharge: 2022-12-24 | Disposition: A | Discharge status: Home / Self Care | Payer: Medicare Other | Source: Ambulatory Visit | Attending: Family Medicine | Admitting: Family Medicine

## 2022-12-24 ENCOUNTER — Other Ambulatory Visit (HOSPITAL_COMMUNITY): Payer: Self-pay | Admitting: Family Medicine

## 2022-12-24 ENCOUNTER — Telehealth (INDEPENDENT_AMBULATORY_CARE_PROVIDER_SITE_OTHER): Payer: Self-pay | Admitting: Gastroenterology

## 2022-12-24 DIAGNOSIS — R52 Pain, unspecified: Secondary | ICD-10-CM

## 2022-12-24 NOTE — Telephone Encounter (Signed)
Hi Larry Graves to patient today, he is back in [place].  He would like to follow-up with you regarding the endoscopic mucosal resection of the semicircumferential large polyp found on his colonoscopy earlier this year.  He would like to have his procedure performed by you. Thanks

## 2022-12-25 NOTE — Telephone Encounter (Signed)
Thanks Vicente Serene, appreciate it

## 2022-12-25 NOTE — Telephone Encounter (Signed)
DCM, Thanks for the update.  Is going to be later in the year or early next year at this point.  Patty, Please get this patient in for a clinic visit (okay to use overbook/held slot/7-day hold). Patient's procedure can be scheduled for January or February. If this does not work, then the patient can be referred elsewhere. GM

## 2023-01-03 ENCOUNTER — Other Ambulatory Visit: Payer: Medicare Other

## 2023-01-03 DIAGNOSIS — N138 Other obstructive and reflux uropathy: Secondary | ICD-10-CM

## 2023-01-04 LAB — PSA: Prostate Specific Ag, Serum: 1.9 ng/mL (ref 0.0–4.0)

## 2023-01-09 NOTE — Telephone Encounter (Signed)
Spoke with the pt and made him aware of the appt with Dr Meridee Score.  I offered to set up the colon EMR but he states he would prefer to speak with Dr Meridee Score to discuss what this entails before committing.

## 2023-01-09 NOTE — Telephone Encounter (Signed)
01/16/23 at 1030 am appt made with GM

## 2023-01-10 ENCOUNTER — Ambulatory Visit: Payer: Medicare Other | Admitting: Urology

## 2023-01-10 ENCOUNTER — Encounter: Payer: Self-pay | Admitting: Urology

## 2023-01-10 VITALS — BP 173/93 | HR 120 | Ht 63.0 in | Wt 173.0 lb

## 2023-01-10 DIAGNOSIS — N401 Enlarged prostate with lower urinary tract symptoms: Secondary | ICD-10-CM

## 2023-01-10 DIAGNOSIS — R3915 Urgency of urination: Secondary | ICD-10-CM

## 2023-01-10 DIAGNOSIS — R972 Elevated prostate specific antigen [PSA]: Secondary | ICD-10-CM

## 2023-01-10 DIAGNOSIS — Z87898 Personal history of other specified conditions: Secondary | ICD-10-CM | POA: Diagnosis not present

## 2023-01-10 DIAGNOSIS — R361 Hematospermia: Secondary | ICD-10-CM

## 2023-01-10 DIAGNOSIS — N138 Other obstructive and reflux uropathy: Secondary | ICD-10-CM

## 2023-01-10 LAB — URINALYSIS, ROUTINE W REFLEX MICROSCOPIC
Bilirubin, UA: NEGATIVE
Glucose, UA: NEGATIVE
Ketones, UA: NEGATIVE
Leukocytes,UA: NEGATIVE
Nitrite, UA: NEGATIVE
Protein,UA: NEGATIVE
RBC, UA: NEGATIVE
Specific Gravity, UA: 1.01 (ref 1.005–1.030)
Urobilinogen, Ur: 0.2 mg/dL (ref 0.2–1.0)
pH, UA: 6 (ref 5.0–7.5)

## 2023-01-10 LAB — BLADDER SCAN AMB NON-IMAGING: Scan Result: 3

## 2023-01-10 MED ORDER — TAMSULOSIN HCL 0.4 MG PO CAPS: 0.4000 mg | ORAL_CAPSULE | Freq: Every day | ORAL | 3 refills | Status: DC

## 2023-01-10 MED ORDER — FINASTERIDE 5 MG PO TABS: 5.0000 mg | ORAL_TABLET | Freq: Every day | ORAL | 3 refills | Status: DC

## 2023-01-10 NOTE — Progress Notes (Signed)
post void residual =67ml

## 2023-01-16 ENCOUNTER — Ambulatory Visit (INDEPENDENT_AMBULATORY_CARE_PROVIDER_SITE_OTHER): Payer: Medicare Other | Admitting: Gastroenterology

## 2023-01-16 ENCOUNTER — Other Ambulatory Visit (INDEPENDENT_AMBULATORY_CARE_PROVIDER_SITE_OTHER): Payer: Medicare Other

## 2023-01-16 ENCOUNTER — Encounter: Payer: Self-pay | Admitting: Gastroenterology

## 2023-01-16 VITALS — BP 140/70 | HR 96 | Ht 63.0 in | Wt 167.2 lb

## 2023-01-16 DIAGNOSIS — Z860100 Personal history of colon polyps, unspecified: Secondary | ICD-10-CM

## 2023-01-16 DIAGNOSIS — D122 Benign neoplasm of ascending colon: Secondary | ICD-10-CM | POA: Diagnosis not present

## 2023-01-16 DIAGNOSIS — Z860101 Personal history of adenomatous and serrated colon polyps: Secondary | ICD-10-CM

## 2023-01-16 LAB — BASIC METABOLIC PANEL
BUN: 25 mg/dL — ABNORMAL HIGH (ref 6–23)
CO2: 30 meq/L (ref 19–32)
Calcium: 10.1 mg/dL (ref 8.4–10.5)
Chloride: 95 meq/L — ABNORMAL LOW (ref 96–112)
Creatinine, Ser: 1.08 mg/dL (ref 0.40–1.50)
GFR: 67.12 mL/min (ref >60.00)
Glucose, Bld: 172 mg/dL — ABNORMAL HIGH (ref 70–99)
Potassium: 4.9 meq/L (ref 3.5–5.1)
Sodium: 132 meq/L — ABNORMAL LOW (ref 135–145)

## 2023-01-16 LAB — CBC
HCT: 38.5 % — ABNORMAL LOW (ref 39.0–52.0)
Hemoglobin: 13 g/dL (ref 13.0–17.0)
MCHC: 33.8 g/dL (ref 30.0–36.0)
MCV: 89.6 fL (ref 78.0–100.0)
Platelets: 325 10*3/uL (ref 150.0–400.0)
RBC: 4.29 Mil/uL (ref 4.22–5.81)
RDW: 13.9 % (ref 11.5–15.5)
WBC: 8.5 10*3/uL (ref 4.0–10.5)

## 2023-01-16 MED ORDER — NA SULFATE-K SULFATE-MG SULF 17.5-3.13-1.6 GM/177ML PO SOLN: 1.0000 | ORAL | 0 refills | Status: AC

## 2023-01-16 NOTE — Patient Instructions (Signed)
Your provider has requested that you go to the basement level for lab work before leaving today. Press "B" on the elevator. The lab is located at the first door on the left as you exit the elevator.  We have sent the following medications to your pharmacy for you to pick up at your convenience: Suprep   Due to recent changes in healthcare laws, you may see the results of your imaging and laboratory studies on MyChart before your provider has had a chance to review them.  We understand that in some cases there may be results that are confusing or concerning to you. Not all laboratory results come back in the same time frame and the provider may be waiting for multiple results in order to interpret others.  Please give [place] 48 hours in order for your provider to thoroughly review all the results before contacting the office for clarification of your results.   _______________________________________________________  If your blood pressure at your visit was 140/90 or greater, please contact your primary care physician to follow up on this.  _______________________________________________________  If you are age 75 or older, your body mass index should be between 23-30. Your Body mass index is 29.63 kg/m. If this is out of the aforementioned range listed, please consider follow up with your Primary Care Provider.  If you are age 75 or younger, your body mass index should be between 19-25. Your Body mass index is 29.63 kg/m. If this is out of the aformentioned range listed, please consider follow up with your Primary Care Provider.   ________________________________________________________  The [facility] providers would like to encourage you to use [facility] to communicate with providers for non-urgent requests or questions.  Due to long hold times on the telephone, sending your provider a message by [facility] may be a faster and more efficient way to get a response.  Please allow 48 business hours for a  response.  Please remember that this is for non-urgent requests.  _______________________________________________________  Thank you for choosing me and [facility].  Dr. Meridee Score

## 2023-01-16 NOTE — Progress Notes (Signed)
GASTROENTEROLOGY OUTPATIENT CLINIC VISIT   Primary Care Provider Benita Stabile, MD [address] [place] [address] [phone]  Referring Provider Dr. Levon Hedger  Patient Profile: Larry Graves is a 77 y.o. male with a pmh significant for diabetes, hypertension, BPH, status post appendectomy, colon polyps (ascending colon polyp and completely removed).  The patient presents to the [facility] for an evaluation and management of problem(s) noted below:  Problem List 1. Adenomatous polyp of ascending colon   2. Hx of adenomatous colonic polyps    Discussed the use of AI scribe software for clinical note transcription with the patient, who gave verbal consent to proceed.  History of Present Illness This is the patient's first visit to the [facility] GI clinic.  The patient states that he had a history of previous colon polyps years ago and had a 5-year follow-up colonoscopy that was unremarkable.  Subsequently, he went 10 years between colonoscopies.  This year in April, he underwent a colonoscopy with Dr. Levon Hedger and was found to have a large polyp in the ascending colon that was unable to be removed completely due to fibrosis.   It is for this reason that the patient is referred for attempt at advanced endoscopic therapy.  The pathology report indicated the polyp was precancerous. The patient has not reported any changes in bowel habits and the recent colonoscopy was a routine screening.   GI Review of Systems Positive as above Negative for dysphagia, odynophagia, pain, alteration of bowel habits, melena, hematochezia  Review of Systems General: Denies fevers/chills/weight loss unintentionally Cardiovascular: Denies chest pain Pulmonary: Denies shortness of breath Gastroenterological: See HPI Genitourinary: Denies darkened urine  Hematological: Denies easy bruising/bleeding Dermatological: Denies jaundice Psychological: Mood is  stable   Medications Current Outpatient Medications  Medication Sig Dispense Refill   AMBULATORY NON FORMULARY MEDICATION Medication Name: Coralin advanced glucose support once daily     amLODipine (NORVASC) 5 MG tablet Take 5 mg by mouth at bedtime.     celecoxib (CELEBREX) 100 MG capsule Take 100 mg by mouth daily.     cholecalciferol (VITAMIN D) 1000 units tablet Take 1,000 Units by mouth daily.     finasteride (PROSCAR) 5 MG tablet Take 1 tablet (5 mg total) by mouth daily. 90 tablet 3   losartan-hydrochlorothiazide (HYZAAR) 100-25 MG tablet Take 1 tablet by mouth daily.     Multiple Vitamin (MULTIVITAMIN WITH MINERALS) TABS tablet Take 1 tablet by mouth daily.     Multiple Vitamins-Minerals (PRESERVISION AREDS 2 PO) Take 1 capsule by mouth in the morning and at bedtime.     Na Sulfate-K Sulfate-Mg Sulf (SUPREP BOWEL PREP KIT) 17.5-3.13-1.6 GM/177ML SOLN Take 1 kit by mouth as directed. For colonoscopy prep 354 mL 0   tamsulosin (FLOMAX) 0.4 MG CAPS capsule Take 1 capsule (0.4 mg total) by mouth daily. 90 capsule 3   traMADol (ULTRAM) 50 MG tablet Take 50 mg by mouth every 6 (six) hours as needed for moderate pain.     vitamin B-12 (CYANOCOBALAMIN) 1000 MCG tablet Take 1,000 mcg by mouth daily.     vitamin C (ASCORBIC ACID) 500 MG tablet Take 500 mg by mouth daily.     No current facility-administered medications for this visit.    Allergies Allergies  Allergen Reactions   Neosporin [Bacitracin-Polymyxin B] Itching   Bacitracin Rash   Desonide Rash   Dicloxacillin Rash   Triamcinolone Rash    Histories Past Medical History:  Diagnosis Date  BPH (benign prostatic hyperplasia)    Diabetes mellitus without complication (HCC)    Heart murmur    History of alcoholism (HCC)    Hypertension    Past Surgical History:  Procedure Laterality Date   APPENDECTOMY  03/12/1962   [facility] in [place], [place]   BACK SURGERY  03/07/2022   L4 S-1   CARPAL TUNNEL RELEASE Bilateral  11/10/1988   [facility] in [place], [place]   COLONOSCOPY WITH PROPOFOL N/A 06/27/2022   Procedure: COLONOSCOPY WITH PROPOFOL;  Surgeon: Dolores Frame, MD;  [facility];  Service: Gastroenterology;  Laterality: N/A;  1:15pm;ASA 3   FOOT SURGERY Left 2010?   bunionectomy-[facility] in [place], [place]   HERNIA REPAIR  [phone]?   [facility] in [place], [place]   MELANOMA EXCISION     on face-all done in office    NASAL SEPTUM SURGERY  11/11/1978   [facility] [place], [place]   POLYPECTOMY  06/27/2022   Procedure: POLYPECTOMY INTESTINAL;  Surgeon: Dolores Frame, MD;  [facility];  Service: Gastroenterology;;   QUADRICEPS TENDON REPAIR Right 03/10/2014   Procedure: REPAIR QUADRICEP TENDON;  Surgeon: Vickki Hearing, MD;  Location: [facility];  Service: Orthopedics;  Laterality: Right;   REPLACEMENT TOTAL KNEE Left 2000?   [facility] in [place], [place]   SUBMUCOSAL LIFTING INJECTION  06/27/2022   Procedure: SUBMUCOSAL LIFTING INJECTION;  Surgeon: Dolores Frame, MD;  [facility];  Service: Gastroenterology;;   SUBMUCOSAL TATTOO INJECTION  06/27/2022   Procedure: SUBMUCOSAL TATTOO INJECTION;  Surgeon: Marguerita Merles, Reuel Boom, MD;  [facility];  Service: Gastroenterology;;   Social History   Socioeconomic History   Marital status: Married    Spouse name: Not on file   Number of children: Not on file   Years of education: Not on file   Highest education level: Not on file  Occupational History   Not on file  Tobacco Use   Smoking status: Never   Smokeless tobacco: Former  Vaping Use   Vaping status: Never Used  Substance and Sexual Activity   Alcohol use: No    Comment: prior alcoholic - stopped 25 years as of 03/08/2014   Drug use: No   Sexual activity: Yes  Other Topics Concern   Not on file  Social History Narrative   Not on file   Social Determinants of Health   Financial Resource  Strain: Not on file  Food Insecurity: Low Risk  (03/07/2022)   [facility]   Hunger Vital Sign    Worried About Running Out of Food in the Last Year: Never true    Within the past 12 months, the food you bought just didn't last and you didn't have money to get more: Not on file  Transportation Needs: No Transportation Needs (03/07/2022)   [facility]   Transportation    In the past 12 months, has lack of reliable transportation kept you from medical appointments, meetings, work or from getting things needed for daily living? : No  Physical Activity: Not on file  Stress: Not on file  Social Connections: Not on file  Intimate Partner Violence: Low Risk  (03/07/2022)   [facility] prior to 05/12/2022., [facility] visits prior to 05/12/2022.   Safety    How often does anyone, including family and friends,  physically hurt you?: Never    How often does anyone, including family and friends, insult or talk down to you?: Never    How often does anyone, including family and friends, threaten you with harm?: Never    How often does anyone, including family and friends, scream or curse at you?: Never   Family History  Problem Relation Age of Onset   Diabetes Mother    Heart murmur Mother    Prostate cancer Brother    Cancer Brother    Colon cancer Neg Hx    Esophageal cancer Neg Hx    Pancreatic cancer Neg Hx    Stomach cancer Neg Hx    Inflammatory bowel disease Neg Hx    Liver disease Neg Hx    Rectal cancer Neg Hx    I have reviewed his medical, social, and family history in detail and updated the electronic medical record as necessary.    PHYSICAL EXAMINATION  BP (!) 140/70   Pulse 96   Ht 5\' 3"  (1.6 m)   Wt 167 lb 4 oz (75.9 kg)   SpO2 95%   BMI 29.63 kg/m  Wt Readings from Last 3 Encounters:  01/16/23 167 lb 4 oz (75.9 kg)  01/10/23 173 lb (78.5 kg)   07/03/22 173 lb (78.5 kg)  GEN: NAD, appears stated age, doesn't appear chronically ill, accompanied by wife PSYCH: Cooperative, without pressured speech EYE: Conjunctivae pink, sclerae anicteric ENT: MMM CV: Nontachycardic RESP: No audible wheezing GI: NABS, soft, NT/ND, without rebound or guarding MSK/EXT: No significant lower extremity edema SKIN: No jaundice NEURO:  Alert & Oriented x 3, no focal deficits   REVIEW OF DATA  I reviewed the following data at the time of this encounter:  GI Procedures and Studies  4/24 Colonoscopy - One 8 mm polyp in the cecum, removed with a cold snare. Resected and retrieved. - One 20 to 25 mm polyp in the distal ascending colon, removed with a hot snare. Incomplete resection via EMR. Resected tissue retrieved. Injected. Tattooed. - One 14 mm polyp in the ascending colon, removed with a hot snare. Resected and retrieved. - Non-bleeding internal hemorrhoids.  Pathology FINAL MICROSCOPIC DIAGNOSIS:  A. COLON, CECUM, ASCENDING, POLYPECTOMY:  - Tubular adenoma(s).  - No high grade dysplasia or malignancy.  B. COLON, ASCENDING, POLYPECTOMY:  - Tubular adenoma.  - No high grade dysplasia or malignancy.   Laboratory Studies  Reviewed in EPIC  Imaging Studies  No relevant studies to review   ASSESSMENT  Mr. Lecrone is a 77 y.o. male with a pmh significant for diabetes, hypertension, BPH, status post appendectomy, colon polyps (ascending colon polyp and completely removed).  The patient is seen today for evaluation and management of:  1. Adenomatous polyp of ascending colon   2. Hx of adenomatous colonic polyps    The patient is hemodynamically and clinically stable.  Based upon the description and endoscopic pictures I do feel that it is reasonable to pursue an Advanced Polypectomy attempt of the polyp/lesion.  We discussed some of the techniques of advanced polypectomy which include Endoscopic Mucosal Resection, OVESCO Full-Thickness Resection,  Endorotor Morcellation, and Tissue Ablation via Fulguration.  We also reviewed images of typical techniques as noted above.  The risks and benefits of endoscopic evaluation were discussed with the patient; these include but are not limited to the risk of perforation, infection, bleeding, missed lesions, lack of diagnosis, severe illness requiring hospitalization, as well as anesthesia and sedation related illnesses.  During attempts  at advanced resection, the risks of bleeding and perforation/leak are increased as opposed to diagnostic and screening procedures, and that was discussed with the patient as well.   In addition, I explained that with the possible need for piecemeal resection, subsequent short-interval endoscopic evaluation for follow up and potential retreatment of the lesion/area may be necessary.  I did offer, a referral to surgery in order for patient to have opportunity to discuss surgical management/intervention prior to finalizing decision for attempt at endoscopic removal, however, the patient deferred on this.  If, after attempt at removal of the polyp/lesion, it is found that the patient has a complication or that an invasive lesion or malignant lesion is found, or that the polyp/lesion continues to recur, the patient is aware and understands that surgery may still be indicated/required.  All patient questions were answered, to the best of my ability, and the patient agrees to the aforementioned plan of action with follow-up as indicated.   PLAN  Preprocedure labs as outlined below Proceed with scheduling colonoscopy with EMR attempt at resection Follow-up to be dictated based on results of colonoscopy   Orders Placed This Encounter  Procedures   Procedural/ Surgical Case Request: COLONOSCOPY WITH PROPOFOL, ENDOSCOPIC MUCOSAL RESECTION   CBC   Basic Metabolic Panel (BMET)   Ambulatory referral to Gastroenterology    New Prescriptions   NA SULFATE-K SULFATE-MG SULF (SUPREP BOWEL  PREP KIT) 17.5-3.13-1.6 GM/177ML SOLN    Take 1 kit by mouth as directed. For colonoscopy prep   Modified Medications   No medications on file    Planned Follow Up No follow-ups on file.   Total Time in Face-to-Face and in Coordination of Care for patient including independent/personal interpretation/review of prior testing, medical history, examination, medication adjustment, communicating results with the patient directly, and documentation within the EHR is 45 minutes.   Corliss Parish, MD [facility]oscopy Office # [phone]

## 2023-01-19 ENCOUNTER — Encounter: Payer: Self-pay | Admitting: Gastroenterology

## 2023-01-19 DIAGNOSIS — D122 Benign neoplasm of ascending colon: Secondary | ICD-10-CM | POA: Insufficient documentation

## 2023-02-21 LAB — LAB REPORT - SCANNED
A1c: 6.5
Albumin, Urine POC: 8
Creatinine, POC: 114.7 mg/dL
EGFR: 76
Microalb Creat Ratio: 7

## 2023-03-08 ENCOUNTER — Encounter (HOSPITAL_COMMUNITY): Payer: Self-pay | Admitting: Gastroenterology

## 2023-03-08 NOTE — Progress Notes (Signed)
Attempted to obtain medical history for pre op call via telephone, unable to reach at this time. HIPAA compliant voicemail message left requesting return call to pre surgical testing department.

## 2023-03-08 NOTE — Progress Notes (Signed)
  Pre op call eval Name:Larry Vernell Barrier MD Cardiologist-Nahser MD  EKG-01/15/22 Echo-05/03/22 Cath-n/a Stress-n/a ICD/PM- n/a Blood thinner-n/a GLP-1-n/a  Hx: Murmur, HTN, DM. Patient last saw cardiology 04/18/22 has an appt in feb 2025, denies any new cardiac issues, no chest pains/SOB. Patient doesnt use any assistive equipment.    Anesthesia Review: Yes

## 2023-03-18 NOTE — Anesthesia Preprocedure Evaluation (Signed)
 Anesthesia Evaluation  Patient identified by MRN, date of birth, ID band Patient awake    Reviewed: Allergy & Precautions, H&P , NPO status , Patient's Chart, lab work & pertinent test results  Airway Mallampati: II  TM Distance: >3 FB Neck ROM: Full    Dental  (+) Dental Advisory Given, Upper Dentures, Lower Dentures   Pulmonary former smoker   Pulmonary exam normal breath sounds clear to auscultation       Cardiovascular Exercise Tolerance: Good METS: 3 - Mets hypertension, Pt. on medications + Valvular Problems/Murmurs AS  Rhythm:Regular Rate:Normal + Systolic murmurs     Neuro/Psych  negative psych ROS   GI/Hepatic negative GI ROS,,,(+)     substance abuse  alcohol use  Endo/Other  diabetes, Well Controlled, Type 2    Renal/GU negative Renal ROS  negative genitourinary   Musculoskeletal negative musculoskeletal ROS (+)    Abdominal   Peds negative pediatric ROS (+)  Hematology negative hematology ROS (+)   Anesthesia Other Findings   Reproductive/Obstetrics negative OB ROS                              Anesthesia Physical Anesthesia Plan  ASA: 3  Anesthesia Plan: MAC   Post-op Pain Management: Minimal or no pain anticipated   Induction: Intravenous  PONV Risk Score and Plan: Propofol  infusion  Airway Management Planned: Nasal Cannula and Natural Airway  Additional Equipment:   Intra-op Plan:   Post-operative Plan:   Informed Consent: I have reviewed the patients History and Physical, chart, labs and discussed the procedure including the risks, benefits and alternatives for the proposed anesthesia with the patient or authorized representative who has indicated his/her understanding and acceptance.     Dental advisory given  Plan Discussed with: CRNA  Anesthesia Plan Comments:          Anesthesia Quick Evaluation

## 2023-03-19 ENCOUNTER — Ambulatory Visit (HOSPITAL_BASED_OUTPATIENT_CLINIC_OR_DEPARTMENT_OTHER): Payer: Medicare Other

## 2023-03-19 ENCOUNTER — Ambulatory Visit (HOSPITAL_COMMUNITY)
Admission: RE | Admit: 2023-03-19 | Discharge: 2023-03-19 | Disposition: A | Discharge status: Home / Self Care | Payer: Medicare Other | Source: Ambulatory Visit | Attending: Gastroenterology | Admitting: Gastroenterology

## 2023-03-19 ENCOUNTER — Encounter (HOSPITAL_COMMUNITY)
Admission: RE | Disposition: A | Discharge status: Home / Self Care | Payer: Self-pay | Source: Ambulatory Visit | Attending: Gastroenterology

## 2023-03-19 ENCOUNTER — Encounter (HOSPITAL_COMMUNITY): Payer: Self-pay | Admitting: Gastroenterology

## 2023-03-19 ENCOUNTER — Ambulatory Visit (HOSPITAL_COMMUNITY): Payer: Self-pay

## 2023-03-19 ENCOUNTER — Other Ambulatory Visit: Payer: Self-pay

## 2023-03-19 DIAGNOSIS — D123 Benign neoplasm of transverse colon: Secondary | ICD-10-CM | POA: Insufficient documentation

## 2023-03-19 DIAGNOSIS — K641 Second degree hemorrhoids: Secondary | ICD-10-CM | POA: Diagnosis not present

## 2023-03-19 DIAGNOSIS — Z860100 Personal history of colon polyps, unspecified: Secondary | ICD-10-CM | POA: Diagnosis not present

## 2023-03-19 DIAGNOSIS — K635 Polyp of colon: Secondary | ICD-10-CM | POA: Diagnosis present

## 2023-03-19 DIAGNOSIS — K5730 Diverticulosis of large intestine without perforation or abscess without bleeding: Secondary | ICD-10-CM | POA: Diagnosis not present

## 2023-03-19 DIAGNOSIS — E119 Type 2 diabetes mellitus without complications: Secondary | ICD-10-CM

## 2023-03-19 DIAGNOSIS — D122 Benign neoplasm of ascending colon: Secondary | ICD-10-CM | POA: Insufficient documentation

## 2023-03-19 DIAGNOSIS — K644 Residual hemorrhoidal skin tags: Secondary | ICD-10-CM | POA: Insufficient documentation

## 2023-03-19 HISTORY — PX: HEMOSTASIS CONTROL: SHX6838

## 2023-03-19 HISTORY — PX: ENDOSCOPIC MUCOSAL RESECTION: SHX6839

## 2023-03-19 HISTORY — PX: COLONOSCOPY WITH PROPOFOL: SHX5780

## 2023-03-19 HISTORY — PX: HOT HEMOSTASIS: SHX5433

## 2023-03-19 HISTORY — PX: SUBMUCOSAL LIFTING INJECTION: SHX6855

## 2023-03-19 HISTORY — PX: POLYPECTOMY: SHX5525

## 2023-03-19 SURGERY — COLONOSCOPY WITH PROPOFOL
Anesthesia: Monitor Anesthesia Care

## 2023-03-19 MED ORDER — PROPOFOL 1000 MG/100ML IV EMUL
INTRAVENOUS | Status: AC
  Filled 2023-03-19: qty 100

## 2023-03-19 MED ORDER — LIDOCAINE 2% (20 MG/ML) 5 ML SYRINGE
INTRAMUSCULAR | Status: DC | PRN
  Administered 2023-03-19: 60 mg via INTRAVENOUS

## 2023-03-19 MED ORDER — PROPOFOL 10 MG/ML IV BOLUS
INTRAVENOUS | Status: DC | PRN
  Administered 2023-03-19: 30 mg via INTRAVENOUS

## 2023-03-19 MED ORDER — PROPOFOL 500 MG/50ML IV EMUL
INTRAVENOUS | Status: DC | PRN
  Administered 2023-03-19: 125 ug/kg/min via INTRAVENOUS

## 2023-03-19 MED ORDER — SODIUM CHLORIDE 0.9 % IV SOLN
INTRAVENOUS | Status: DC | PRN
Start: 2023-03-19 — End: 2023-03-19

## 2023-03-19 SURGICAL SUPPLY — 20 items

## 2023-03-19 NOTE — Discharge Instructions (Signed)

## 2023-03-19 NOTE — H&P (Signed)
 GASTROENTEROLOGY PROCEDURE H&P NOTE   Primary Care Physician: Shona Norleen PEDLAR, MD  HPI: Larry Graves is a 78 y.o. male who presents for Colonoscopy for attempt at [facility] polyp resection after previous incomplete resection.  Past Medical History:  Diagnosis Date   BPH (benign prostatic hyperplasia)    Diabetes mellitus without complication (HCC)    Heart murmur    History of alcoholism (HCC)    Hypertension    Past Surgical History:  Procedure Laterality Date   APPENDECTOMY  03/12/1962   [facility] in [place], [place]   BACK SURGERY  03/07/2022   L4 S-1   CARPAL TUNNEL RELEASE Bilateral 11/10/1988   [facility] in [place], [place]   COLONOSCOPY WITH PROPOFOL  N/A 06/27/2022   Procedure: COLONOSCOPY WITH PROPOFOL ;  Surgeon: Eartha Angelia Sieving, MD;  [facility];  Service: Gastroenterology;  Laterality: N/A;  1:15pm;ASA 3   FOOT SURGERY Left 2010?   bunionectomy-[facility] in [place], [place]   HERNIA REPAIR  [phone]?   [facility] in [place], [place]   MELANOMA EXCISION     on face-all done in office    NASAL SEPTUM SURGERY  11/11/1978   [facility] in [place], [place]   POLYPECTOMY  06/27/2022   Procedure: POLYPECTOMY INTESTINAL;  Surgeon: Eartha Angelia Sieving, MD;  [facility];  Service: Gastroenterology;;   QUADRICEPS TENDON REPAIR Right 03/10/2014   Procedure: REPAIR QUADRICEP TENDON;  Surgeon: Taft FORBES Minerva, MD;  Location: [facility];  Service: Orthopedics;  Laterality: Right;   REPLACEMENT TOTAL KNEE Left 2000?   [facility] in [place], [place]   SUBMUCOSAL LIFTING INJECTION  06/27/2022   Procedure: SUBMUCOSAL LIFTING INJECTION;  Surgeon: Eartha Angelia Sieving, MD;  [facility];  Service: Gastroenterology;;   SUBMUCOSAL TATTOO INJECTION  06/27/2022   Procedure: SUBMUCOSAL TATTOO INJECTION;  Surgeon: Eartha Angelia, Sieving, MD;  [facility];  Service: Gastroenterology;;   No current facility-administered  medications for this encounter.   No current facility-administered medications for this encounter. Allergies  Allergen Reactions   Neosporin [Bacitracin-Polymyxin B] Itching   Bacitracin Rash   Desonide Rash   Dicloxacillin Rash   Triamcinolone Rash   Family History  Problem Relation Age of Onset   Diabetes Mother    Heart murmur Mother    Prostate cancer Brother    Cancer Brother    Colon cancer Neg Hx    Esophageal cancer Neg Hx    Pancreatic cancer Neg Hx    Stomach cancer Neg Hx    Inflammatory bowel disease Neg Hx    Liver disease Neg Hx    Rectal cancer Neg Hx    Social History   Socioeconomic History   Marital status: Married    Spouse name: Not on file   Number of children: Not on file   Years of education: Not on file   Highest education level: Not on file  Occupational History   Not on file  Tobacco Use   Smoking status: Never   Smokeless tobacco: Former  [employer]vertising Account [employer]   Vaping status: Never Used  Substance and Sexual Activity   Alcohol use: No    Comment: prior alcoholic - stopped 25 years as of 03/08/2014   Drug use: No   Sexual activity: Yes  Other Topics Concern   Not on file  Social History Narrative   Not on file   Social Drivers of Health   Financial Resource Strain: Not  on file  Food Insecurity: Low Risk  (03/07/2022)   [facility]   Hunger Vital Sign    Worried About Running Out of Food in the Last Year: Never true    Within the past 12 months, the food you bought just didn't last and you didn't have money to get more: Not on file  Transportation Needs: No Transportation Needs (03/07/2022)   [facility]   Transportation    In the past 12 months, has lack of reliable transportation kept you from medical appointments, meetings, work or from getting things needed for daily living? : No  Physical Activity: Not on file  Stress: Not on file  Social Connections: Not on file   Intimate Partner Violence: Low Risk  (03/07/2022)   [facility] prior to 05/12/2022., [facility] visits prior to 05/12/2022.   Safety    How often does anyone, including family and friends, physically hurt you?: Never    How often does anyone, including family and friends, insult or talk down to you?: Never    How often does anyone, including family and friends, threaten you with harm?: Never    How often does anyone, including family and friends, scream or curse at you?: Never    Physical Exam: Today's Vitals   03/19/23 0701  BP: (!) 163/72  Pulse: 81  Resp: 15  Temp: 97.9 F (36.6 C)  TempSrc: Tympanic  SpO2: 97%  Weight: 75.9 kg  Height: 5' 3 (1.6 m)  PainSc: 0-No pain   Body mass index is 29.64 kg/m. GEN: NAD EYE: Sclerae anicteric ENT: MMM CV: Non-tachycardic GI: Soft, NT/ND NEURO:  Alert & Oriented x 3  Lab Results: No results for input(s): WBC, HGB, HCT, PLT in the last 72 hours. BMET No results for input(s): NA, K, CL, CO2, GLUCOSE, BUN, CREATININE, CALCIUM in the last 72 hours. LFT No results for input(s): PROT, ALBUMIN, AST, ALT, ALKPHOS, BILITOT, BILIDIR, IBILI in the last 72 hours. PT/INR No results for input(s): LABPROT, INR in the last 72 hours.   Impression / Plan: This is a 78 y.o.male who presents for Colonoscopy for attempt at [facility] polyp resection after previous incomplete resection.  The risks and benefits of endoscopic evaluation/treatment were discussed with the patient and/or family; these include but are not limited to the risk of perforation, infection, bleeding, missed lesions, lack of diagnosis, severe illness requiring hospitalization, as well as anesthesia and sedation related illnesses.  The patient's history has been reviewed, patient examined, no change in status, and deemed stable for procedure.  The patient and/or family is agreeable  to proceed.    Aloha Finner, MD [facility] Advanced [facility]oscopy Office # [phone]

## 2023-03-19 NOTE — Op Note (Signed)
 [facility] Patient Name: Larry Graves Procedure Date: 03/19/2023 MRN: [id number] Attending MD: Aloha Finner , MD, [id number] Date of Birth: [birth year] CSN: [id number] Age: 78 Admit Type: Outpatient Procedure:                Colonoscopy Indications:              Excision of colonic polyp Providers:                Aloha Finner, MD, Burnard Fire RN, RN,                            Haskel Chris, Technician Referring MD:              Medicines:                Monitored Anesthesia Care Complications:            No immediate complications. Estimated Blood Loss:     Estimated blood loss was minimal. Procedure:                Pre-Anesthesia Assessment:                           - Prior to the procedure, a History and Physical                            was performed, and patient medications and                            allergies were reviewed. The patient's tolerance of                            previous anesthesia was also reviewed. The risks                            and benefits of the procedure and the sedation                            options and risks were discussed with the patient.                            All questions were answered, and informed consent                            was obtained. Prior Anticoagulants: The patient has                            taken no anticoagulant or antiplatelet agents. ASA                            Grade Assessment: III - A patient with severe                            systemic disease. After reviewing the risks and  benefits, the patient was deemed in satisfactory                            condition to undergo the procedure.                           After obtaining informed consent, the colonoscope                            was passed under direct vision. Throughout the                            procedure, the patient's blood pressure, pulse, and                             oxygen saturations were monitored continuously. The                            CF-HQ190L ([id number]) Olympus colonoscope was                            introduced through the anus and advanced to the the                            cecum, identified by appendiceal orifice and                            ileocecal valve. The colonoscopy was performed with                            moderate difficulty due to significant looping.                            Successful completion of the procedure was aided by                            changing the patient's position, using manual                            pressure, straightening and shortening the scope to                            obtain bowel loop reduction and using scope                            torsion. The patient tolerated the procedure. The                            quality of the bowel preparation was adequate. The                            ileocecal valve, appendiceal orifice, and rectum  were photographed. Scope In: 8:12:24 AM Scope Out: 9:20:44 AM Scope Withdrawal Time: 1 hour 3 minutes 27 seconds  Total Procedure Duration: 1 hour 8 minutes 20 seconds  Findings:      The digital rectal exam findings include hemorrhoids. Pertinent       negatives include no palpable rectal lesions.      A medium post mucosectomy scar was found in the distal ascending colon.       This was found proximal to a previously placed spot tattoo. This is a       very difficult position that required significant looping versus       significant straightening with very short area of workable maneuvering       without losing position significantly into the distal transverse colon.       There was evidence of residual polyp tissue with fibrosis and scar on       both sides. Adjacent mucosal findings include congestion. Preparations       were made for further attempt at resection. Demarcation of the lesion       was performed with  high-definition white light and narrow band imaging       to clearly identify the boundaries of the lesion. Endoclot was injected       to raise the lesion, with only partial success due to the fibrosis.       Piecemeal mucosal resection using a snare was performed with snare       technique. Resection was incomplete. The resected tissue was retrieved.       Fulguration to ablate the lesion remnants by forcep avulsion was       successful. Fulguration to ablate the lesion margin and base by argon       plasma was successful. Area was successfully injected with 2 mL PuraStat       for hemostasis (2 difficult to maintain a position to try to clip close       the area.      A small post mucosectomy scar was found in the ascending colon. There       was residual polypoid tissue. This was removed with a cold snare for       histology.      Two sessile polyps were found in the transverse colon. The polyps were 4       to 6 mm in size. These polyps were removed with a cold snare. Resection       and retrieval were complete.      Multiple small-mouthed diverticula were found in the recto-sigmoid       colon, sigmoid colon and descending colon.      Non-bleeding non-thrombosed external and internal hemorrhoids were found       during retroflexion, during perianal exam and during digital exam. The       hemorrhoids were Grade II (internal hemorrhoids that prolapse but reduce       spontaneously). Impression:               - Hemorrhoids found on digital rectal exam.                           - Post mucosectomy scar in the distal ascending                            colon, with persistent polypoid tissue  and fibrosis                            (noted proximal to a tattoo previously placed).                            Treated with piecemeal mucosal resection, forcep                            avulsion, APC coagulation to margins/base. PuraStat                            injected for hemostasis.                            - Post mucosectomy scar in the ascending colon with                            possible recurrence. This area was removed with                            snare.                           - Two 4 to 6 mm polyps in the transverse colon,                            removed with a cold snare. Resected and retrieved.                           - Diverticulosis in the recto-sigmoid colon, in the                            sigmoid colon and in the descending colon.                           - Non-bleeding non-thrombosed external and internal                            hemorrhoids. Moderate Sedation:      Not Applicable - Patient had care per Anesthesia. Recommendation:           - The patient will be observed post-procedure,                            until all discharge criteria are met.                           - Discharge patient to home.                           - Patient has a contact number available for                            emergencies. The signs and symptoms of potential  delayed complications were discussed with the                            patient. Return to normal activities tomorrow.                            Written discharge instructions were provided to the                            patient.                           - High fiber diet.                           - Use FiberCon 1-2 tablets PO daily.                           - Await pathology results.                           - Repeat colonoscopy in 6 to 9 months for                            surveillance after piecemeal polypectomy as long as                            no high-grade dysplasia (this is a difficult                            location to consider OVESCO FT RD. Endorotor may be                            possible if it position can be maintained, but it                            is very likely that if this has significant                            recurrence,  patient will need surgical                            intervention..                           - The findings and recommendations were discussed                            with the patient.                           - The findings and recommendations were discussed                            with the patient's family. Procedure Code(s):        ---  Professional ---                           [phone], Colonoscopy, flexible; with endoscopic                            mucosal resection                           [phone], 59, Colonoscopy, flexible; with removal of                            tumor(s), polyp(s), or other lesion(s) by snare                            technique Diagnosis Code(s):        --- Professional ---                           S01.109, Other specified postprocedural states                           D12.3, Benign neoplasm of transverse colon (hepatic                            flexure or splenic flexure)                           K64.1, Second degree hemorrhoids                           K63.5, Polyp of colon                           K57.30, Diverticulosis of large intestine without                            perforation or abscess without bleeding CPT copyright 2022 American Medical Association. All rights reserved. The codes documented in this report are preliminary and upon coder review may  be revised to meet current compliance requirements. Aloha Finner, MD 03/19/2023 9:49:47 AM Number of Addenda: 0

## 2023-03-19 NOTE — Transfer of Care (Signed)
 Immediate Anesthesia Transfer of Care Note  Patient: Larry Graves  Procedure(s) Performed: COLONOSCOPY WITH PROPOFOL  ENDOSCOPIC MUCOSAL RESECTION SUBMUCOSAL LIFTING INJECTION HOT HEMOSTASIS (ARGON PLASMA COAGULATION/BICAP) HEMOSTASIS CONTROL POLYPECTOMY  Patient Location: PACU  Anesthesia Type:MAC  Level of Consciousness: alert , oriented, and sedated  Airway & Oxygen Therapy: Patient Spontanous Breathing and Patient connected to face mask oxygen  Post-op Assessment: Report given to RN and Post -op Vital signs reviewed and stable  Post vital signs: Reviewed and stable  Last Vitals:  Vitals Value Taken Time  BP    Temp    Pulse    Resp    SpO2      Last Pain:  Vitals:   03/19/23 0701  TempSrc: Tympanic  PainSc: 0-No pain         Complications: No notable events documented.

## 2023-03-19 NOTE — Anesthesia Postprocedure Evaluation (Signed)
 Anesthesia Post Note  Patient: Larry Graves  Procedure(s) Performed: COLONOSCOPY WITH PROPOFOL  ENDOSCOPIC MUCOSAL RESECTION SUBMUCOSAL LIFTING INJECTION HOT HEMOSTASIS (ARGON PLASMA COAGULATION/BICAP) HEMOSTASIS CONTROL POLYPECTOMY     Patient location during evaluation: PACU Anesthesia Type: MAC Level of consciousness: awake and alert Pain management: pain level controlled Vital Signs Assessment: post-procedure vital signs reviewed and stable Respiratory status: spontaneous breathing, nonlabored ventilation, respiratory function stable and patient connected to nasal cannula oxygen Cardiovascular status: stable and blood pressure returned to baseline Postop Assessment: no apparent nausea or vomiting Anesthetic complications: no   No notable events documented.  Last Vitals:  Vitals:   03/19/23 0940 03/19/23 0950  BP: (!) 161/73 (!) 169/95  Pulse: 65 67  Resp: 16 13  Temp:    SpO2: 100% 100%    Last Pain:  Vitals:   03/19/23 0940  TempSrc:   PainSc: 0-No pain                 Thom JONELLE Peoples

## 2023-03-20 LAB — SURGICAL PATHOLOGY

## 2023-03-22 ENCOUNTER — Encounter (HOSPITAL_COMMUNITY): Payer: Self-pay | Admitting: Gastroenterology

## 2023-03-23 ENCOUNTER — Encounter: Payer: Self-pay | Admitting: Gastroenterology

## 2023-04-23 ENCOUNTER — Encounter: Payer: Self-pay | Admitting: Cardiovascular Disease
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
## 2023-04-24 ENCOUNTER — Ambulatory Visit: Payer: Medicare Other | Attending: Cardiovascular Disease | Admitting: Cardiovascular Disease

## 2023-04-24 ENCOUNTER — Encounter: Payer: Self-pay | Admitting: Cardiovascular Disease

## 2023-04-24 VITALS — BP 145/70 | HR 104 | Ht 63.0 in | Wt 172.4 lb

## 2023-04-24 DIAGNOSIS — I10 Essential (primary) hypertension: Secondary | ICD-10-CM

## 2023-04-24 DIAGNOSIS — R011 Cardiac murmur, unspecified: Secondary | ICD-10-CM

## 2023-04-24 DIAGNOSIS — R000 Tachycardia, unspecified: Secondary | ICD-10-CM | POA: Diagnosis not present

## 2023-04-24 MED ORDER — METOPROLOL TARTRATE 25 MG PO TABS: 25.0000 mg | ORAL_TABLET | Freq: Two times a day (BID) | ORAL | 3 refills | Status: DC

## 2023-04-24 NOTE — Patient Instructions (Signed)
Medication Instructions:  START Metoprolol Tartrate 25mg  twice daily *If you need a refill on your cardiac medications before your next appointment, please call your pharmacy*  Follow-Up: At [facility], you and your health needs are our priority.  As part of our continuing mission to provide you with exceptional heart care, we have created designated Provider Care Teams.  These Care Teams include your primary Cardiologist (physician) and Advanced Practice Providers (APPs -  Physician Assistants and Nurse Practitioners) who all work together to provide you with the care you need, when you need it.  We recommend signing up for the patient portal called "MyChart".  Sign up information is provided on this After Visit Summary.  MyChart is used to connect with patients for Virtual Visits (Telemedicine).  Patients are able to view lab/test results, encounter notes, upcoming appointments, etc.  Non-urgent messages can be sent to your provider as well.   To learn more about what you can do with MyChart, go to [email or web address].    Your next appointment:   3 month(s)  Provider:   Kristeen Miss, MD      1st Floor: - Lobby - Registration  - Pharmacy  - Lab - Cafe  2nd Floor: - PV Lab - Diagnostic Testing (echo, CT, nuclear med)  3rd Floor: - Vacant  4th Floor: - TCTS (cardiothoracic surgery) - AFib Clinic - Structural Heart Clinic - Vascular Surgery  - Vascular Ultrasound  5th Floor: - HeartCare Cardiology (general and EP) - Clinical Pharmacy for coumadin, hypertension, lipid, weight-loss medications, and med management appointments    Valet parking services will be available as well.

## 2023-05-08 ENCOUNTER — Telehealth: Payer: Self-pay | Admitting: Cardiovascular Disease

## 2023-05-08 NOTE — Telephone Encounter (Signed)
 STAT if HR is under 50 or over 120 (normal HR is 60-100 beats per minute)  What is your heart rate? 58, 61; was told that it should be around 80 bpm  Do you have a log of your heart rate readings (document readings)? Yes   Do you have any other symptoms? 133/72

## 2023-05-08 NOTE — Telephone Encounter (Signed)
 Left voicemail to return call to office

## 2023-05-08 NOTE — Telephone Encounter (Signed)
 Pt returning call/requesting c/b

## 2023-05-08 NOTE — Telephone Encounter (Signed)
 Spoke with Larry Graves who reports HR this morning is 58 prior to Metoprolol.  Larry Graves states his HR was low last night and he did not take the Metoprolol last night.  Larry Graves does not have consistent HR readings.  Larry Graves advised if HR is below 60 he should hold dose of Metoprolol and recheck BP and HR prior to taking next dose of Metoprolol.  Larry Graves also reports increased fatigue since starting this medication.  Denies current CP, SOB or dizziness.  Larry Graves advised will forward to Dr Elease Hashimoto, who is out of the office until Friday.  Please continue to monitor BP and HR and call Friday morning with that information.  Larry Graves to contact office sooner if Sx increase.  Larry Graves verbalize understanding and agrees with current plan.

## 2023-05-09 MED ORDER — METOPROLOL TARTRATE 25 MG PO TABS: 12.5000 mg | ORAL_TABLET | Freq: Two times a day (BID) | ORAL | Status: DC

## 2023-05-09 NOTE — Telephone Encounter (Signed)
 Spoke with pt and advised of Dr Harvie Bridge recommendation as below.  Pt verbalizes understanding and agrees with current plan.   Nahser, Deloris Ping, MD  Alois Cliche, RN Cc: Lars Mage, RN Caller: Unspecified (Yesterday,  9:21 AM) Its not clear to me whether or not he was having any symptoms of syncope. Patients frequently will have some fatigue when first starting metoprolol.  The fatigue will frequently clear after a week or so  Lets have him try Metoprolol 12.5 mg  BID ( 1/2 tablet BID) And see if he feels better .   If he feels ok, Im not necessarily concerned that he has an occasional HR of 60

## 2023-06-28 ENCOUNTER — Other Ambulatory Visit: Payer: Medicare Other

## 2023-06-29 LAB — PSA, TOTAL AND FREE
PSA, Free Pct: 8.3 %
PSA, Free: 0.1 ng/mL
Prostate Specific Ag, Serum: 1.2 ng/mL (ref 0.0–4.0)

## 2023-07-04 ENCOUNTER — Ambulatory Visit: Payer: Medicare Other | Admitting: Urology

## 2023-07-04 ENCOUNTER — Encounter: Payer: Self-pay | Admitting: Urology

## 2023-07-04 VITALS — BP 164/80 | HR 59

## 2023-07-04 DIAGNOSIS — R3915 Urgency of urination: Secondary | ICD-10-CM | POA: Diagnosis not present

## 2023-07-04 DIAGNOSIS — R972 Elevated prostate specific antigen [PSA]: Secondary | ICD-10-CM

## 2023-07-04 DIAGNOSIS — N138 Other obstructive and reflux uropathy: Secondary | ICD-10-CM | POA: Diagnosis not present

## 2023-07-04 DIAGNOSIS — N401 Enlarged prostate with lower urinary tract symptoms: Secondary | ICD-10-CM | POA: Diagnosis not present

## 2023-07-04 DIAGNOSIS — Z87438 Personal history of other diseases of male genital organs: Secondary | ICD-10-CM

## 2023-07-04 DIAGNOSIS — R361 Hematospermia: Secondary | ICD-10-CM

## 2023-07-04 MED ORDER — TAMSULOSIN HCL 0.4 MG PO CAPS: 0.4000 mg | ORAL_CAPSULE | Freq: Every day | ORAL | 3 refills | Status: AC

## 2023-07-04 MED ORDER — FINASTERIDE 5 MG PO TABS: 5.0000 mg | ORAL_TABLET | Freq: Every day | ORAL | 3 refills | Status: AC

## 2023-07-04 NOTE — Progress Notes (Unsigned)
 Subjective:  1. BPH with urinary obstruction   2. Urgency of urination   3. Hematospermia   4. Elevated PSA     He continues to void well on tamsulosin  and finasteride . His IPSS is 3 with nocturia x 1 and some urgency.  He has occasional dysuria. His UA is clear.  He has not had hemospermia recently but hasn't been sexually active.    He drinks a lot of fluid.  His PSA is back down to 1.2 after rising to 1.9 on 01/03/23 from 1.2 on 12/23/20 .  He has no associated signs or symptoms.    IPSS     Row Name 07/04/23 1500         International Prostate Symptom Score   How often have you had the sensation of not emptying your bladder? Not at All     How often have you had to urinate less than every two hours? Not at All     How often have you found you stopped and started again several times when you urinated? Not at All     How often have you found it difficult to postpone urination? Less than 1 in 5 times     How often have you had a weak urinary stream? Less than 1 in 5 times     How often have you had to strain to start urination? Not at All     How many times did you typically get up at night to urinate? 1 Time     Total IPSS Score 3       Quality of Life due to urinary symptoms   If you were to spend the rest of your life with your urinary condition just the way it is now how would you feel about that? Mostly Satisfied                  ROS:  ROS:  A complete review of systems was performed.  All systems are negative except for pertinent findings as noted.   Review of Systems  HENT:  Positive for congestion.   Musculoskeletal:  Positive for back pain and joint pain.  All other systems reviewed and are negative.       Allergies  Allergen Reactions   Neosporin [Bacitracin-Polymyxin B] Itching   Bacitracin Rash   Desonide Rash   Dicloxacillin Rash   Triamcinolone Rash    Outpatient Encounter Medications as of 07/04/2023  Medication Sig   AMBULATORY NON  FORMULARY MEDICATION Medication Name: Coralin advanced glucose support once daily   amLODipine (NORVASC) 5 MG tablet Take 5 mg by mouth at bedtime.   celecoxib (CELEBREX) 100 MG capsule Take 100 mg by mouth daily.   cholecalciferol (VITAMIN D) 1000 units tablet Take 1,000 Units by mouth daily.   finasteride  (PROSCAR ) 5 MG tablet Take 1 tablet (5 mg total) by mouth daily.   losartan-hydrochlorothiazide (HYZAAR) 100-25 MG tablet Take 1 tablet by mouth daily.   metoprolol  tartrate (LOPRESSOR ) 25 MG tablet Take 0.5 tablets (12.5 mg total) by mouth 2 (two) times daily.   Multiple Vitamin (MULTIVITAMIN WITH MINERALS) TABS tablet Take 1 tablet by mouth daily.   Multiple Vitamins-Minerals (PRESERVISION AREDS 2 PO) Take 1 capsule by mouth in the morning and at bedtime.   Na Sulfate-K Sulfate-Mg Sulf (SUPREP BOWEL PREP KIT) 17.5-3.13-1.6 GM/177ML SOLN Take 1 kit by mouth as directed. For colonoscopy prep   rosuvastatin (CRESTOR) 5 MG tablet Take 5 mg by mouth daily.  tamsulosin  (FLOMAX ) 0.4 MG CAPS capsule Take 1 capsule (0.4 mg total) by mouth daily.   traMADol (ULTRAM) 50 MG tablet Take 50 mg by mouth every 6 (six) hours as needed for moderate pain.   vitamin B-12 (CYANOCOBALAMIN) 1000 MCG tablet Take 1,000 mcg by mouth daily.   vitamin C (ASCORBIC ACID) 500 MG tablet Take 500 mg by mouth daily.   [DISCONTINUED] finasteride  (PROSCAR ) 5 MG tablet Take 1 tablet (5 mg total) by mouth daily.   [DISCONTINUED] tamsulosin  (FLOMAX ) 0.4 MG CAPS capsule Take 1 capsule (0.4 mg total) by mouth daily.   No facility-administered encounter medications on file as of 07/04/2023.    Past Medical History:  Diagnosis Date   BPH (benign prostatic hyperplasia)    Diabetes mellitus without complication (HCC)    Heart murmur    History of alcoholism (HCC)    Hypertension     Past Surgical History:  Procedure Laterality Date   APPENDECTOMY  03/12/1962   [facility] in [place], [place]   BACK SURGERY  03/07/2022    L4 S-1   CARPAL TUNNEL RELEASE Bilateral 11/10/1988   [facility] in [place], [place]   COLONOSCOPY WITH PROPOFOL  N/A 06/27/2022   Procedure: COLONOSCOPY WITH PROPOFOL ;  Surgeon: Urban Garden, MD;  [facility];  Service: Gastroenterology;  Laterality: N/A;  1:15pm;ASA 3   COLONOSCOPY WITH PROPOFOL  N/A 03/19/2023   Procedure: COLONOSCOPY WITH PROPOFOL ;  Surgeon: Brice Campi Albino Alu., MD;  [facility];  Service: Gastroenterology;  Laterality: N/A;   ENDOSCOPIC MUCOSAL RESECTION N/A 03/19/2023   Procedure: ENDOSCOPIC MUCOSAL RESECTION;  Surgeon: Brice Campi Albino Alu., MD;  [facility];  Service: Gastroenterology;  Laterality: N/A;   FOOT SURGERY Left 2010?   bunionectomy-[facility] in [place], [place]   HEMOSTASIS CONTROL  03/19/2023   Procedure: HEMOSTASIS CONTROL;  Surgeon: Brice Campi Albino Alu., MD;  Location: [facility];  Service: Gastroenterology;;   HERNIA REPAIR  1953?   [facility] in [place], [place]   [place] HEMOSTASIS N/A 03/19/2023   Procedure: HOT HEMOSTASIS (ARGON PLASMA COAGULATION/BICAP);  Surgeon: Normie Becton., MD;  Location: [facility];  Service: Gastroenterology;  Laterality: N/A;   MELANOMA EXCISION     on face-all done in office    NASAL SEPTUM SURGERY  11/11/1978   [facility] [place], [place]   POLYPECTOMY  06/27/2022   Procedure: POLYPECTOMY INTESTINAL;  Surgeon: Urban Garden, MD;  [facility];  Service: Gastroenterology;;   POLYPECTOMY  03/19/2023   Procedure: POLYPECTOMY;  Surgeon: Normie Becton., MD;  Location: [facility];  Service: Gastroenterology;;   QUADRICEPS TENDON REPAIR Right 03/10/2014   Procedure: REPAIR QUADRICEP TENDON;  Surgeon: Darrin Emerald, MD;  [facility];  Service: Orthopedics;  Laterality: Right;   REPLACEMENT TOTAL KNEE Left 2000?   [facility] in [place], [place]   SUBMUCOSAL LIFTING INJECTION  06/27/2022   Procedure: SUBMUCOSAL  LIFTING INJECTION;  Surgeon: Urban Garden, MD;  [facility];  Service: Gastroenterology;;   Jona Negro INJECTION  03/19/2023   Procedure: SUBMUCOSAL LIFTING INJECTION;  Surgeon: Normie Becton., MD;  Location: [facility];  Service: Gastroenterology;;   SUBMUCOSAL TATTOO INJECTION  06/27/2022   Procedure: SUBMUCOSAL TATTOO INJECTION;  Surgeon: Urban Garden, MD;  [facility];  Service: Gastroenterology;;    Social History   Socioeconomic History   Marital status: Married    Spouse name: Not on file   Number of children:  Not on file   Years of education: Not on file   Highest education level: Not on file  Occupational History   Not on file  Tobacco Use   Smoking status: Never   Smokeless tobacco: Former  Psychologist, educational Use   Vaping status: Never Used  Substance and Sexual Activity   Alcohol use: No    Comment: prior alcoholic - stopped 25 years as of 03/08/2014   Drug use: No   Sexual activity: Yes  Other Topics Concern   Not on file  Social History Narrative   Not on file   Social Drivers of Health   Financial Resource Strain: Not on file  Food Insecurity: Low Risk  (03/07/2022)   [facility]   Hunger Vital Sign    Worried About Running Out of Food in the Last Year: Never true    Within the past 12 months, the food you bought just didn't last and you didn't have money to get more: Not on file  Transportation Needs: No Transportation Needs (03/07/2022)   [facility]   Transportation    In the past 12 months, has lack of reliable transportation kept you from medical appointments, meetings, work or from getting things needed for daily living? : No  Physical Activity: Not on file  Stress: Not on file  Social Connections: Not on file  Intimate Partner Violence: Low Risk  (03/07/2022)   [facility] prior to 05/12/2022.,  [facility] visits prior to 05/12/2022.   Safety    How often does anyone, including family and friends, physically hurt you?: Never    How often does anyone, including family and friends, insult or talk down to you?: Never    How often does anyone, including family and friends, threaten you with harm?: Never    How often does anyone, including family and friends, scream or curse at you?: Never    Family History  Problem Relation Age of Onset   Diabetes Mother    Heart murmur Mother    Prostate cancer Brother    Cancer Brother    Colon cancer Neg Hx    Esophageal cancer Neg Hx    Pancreatic cancer Neg Hx    Stomach cancer Neg Hx    Inflammatory bowel disease Neg Hx    Liver disease Neg Hx    Rectal cancer Neg Hx        Objective: Vitals:   07/04/23 1529  BP: (!) 164/80  Pulse: (!) 59      Physical Exam Vitals reviewed.  Constitutional:      Appearance: Normal appearance.  Neurological:     Mental Status: He is alert.     Lab Results:  UA is unremarkable.    Studies/Results:   Assessment & Plan: History of Hematospermia.   He has had no recurrent bleeding and the exam is unremarkable.  BPH with BOO and urgency.  He will continue finasteride  and tamsulosin . Meds refilled.   PSA is back down on finasteride  but still low.  Repeat in a year.   His older brother has a history of prostate cancer.   Meds ordered this encounter  Medications   tamsulosin  (FLOMAX ) 0.4 MG CAPS capsule    Sig: Take 1 capsule (0.4 mg total) by mouth daily.    Dispense:  100 capsule    Refill:  3   finasteride  (PROSCAR ) 5 MG tablet  Sig: Take 1 tablet (5 mg total) by mouth daily.    Dispense:  100 tablet    Refill:  3      Orders Placed This Encounter  Procedures   Urinalysis, Routine w reflex microscopic   PSA, total and free    Standing Status:   Future    Expected Date:   06/26/2024    Expiration Date:   07/03/2024      Return in about 1 year  (around 07/03/2024) for with PSA to see any available provider. .   CC: Larry Bickers, MD      Larry Graves 07/05/2023 Patient ID: Larry Graves, male   DOB: 01/31/1946, 78 y.o.   MRN: [id number]

## 2023-07-05 LAB — URINALYSIS, ROUTINE W REFLEX MICROSCOPIC
Bilirubin, UA: NEGATIVE
Glucose, UA: NEGATIVE
Ketones, UA: NEGATIVE
Leukocytes,UA: NEGATIVE
Nitrite, UA: NEGATIVE
Protein,UA: NEGATIVE
RBC, UA: NEGATIVE
Specific Gravity, UA: 1.01 (ref 1.005–1.030)
Urobilinogen, Ur: 1 mg/dL (ref 0.2–1.0)
pH, UA: 6.5 (ref 5.0–7.5)

## 2023-07-11 ENCOUNTER — Encounter: Payer: Self-pay | Admitting: Cardiovascular Disease
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
## 2023-07-12 ENCOUNTER — Encounter: Payer: Self-pay | Admitting: Cardiovascular Disease

## 2023-07-12 ENCOUNTER — Ambulatory Visit: Payer: Medicare Other | Attending: Cardiology | Admitting: Cardiovascular Disease

## 2023-07-12 VITALS — BP 138/70 | HR 68 | Ht 63.0 in | Wt 172.2 lb

## 2023-07-12 DIAGNOSIS — I10 Essential (primary) hypertension: Secondary | ICD-10-CM | POA: Insufficient documentation

## 2023-07-12 DIAGNOSIS — I350 Nonrheumatic aortic (valve) stenosis: Secondary | ICD-10-CM | POA: Insufficient documentation

## 2023-07-12 MED ORDER — METOPROLOL TARTRATE 25 MG PO TABS: 12.5000 mg | ORAL_TABLET | Freq: Every day | ORAL | 3 refills | Status: AC

## 2023-07-12 NOTE — Patient Instructions (Signed)
 Medication Instructions:  Your physician has recommended you make the following change in your medication:  1-Decrease Metoprolol  12.5 mg by mouth daily.  *If you need a refill on your cardiac medications before your next appointment, please call your pharmacy*  Lab Work: If you have labs (blood work) drawn today and your tests are completely normal, you will receive your results only by: MyChart Message (if you have MyChart) OR A paper copy in the mail If you have any lab test that is abnormal or we need to change your treatment, we will call you to review the results.  Testing/Procedures: None ordered today.  Follow-Up: At [facility], you and your health needs are our priority.  As part of our continuing mission to provide you with exceptional heart care, our providers are all part of one team.  This team includes your primary Cardiologist (physician) and Advanced Practice Providers or APPs (Physician Assistants and Nurse Practitioners) who all work together to provide you with the care you need, when you need it.  Your next appointment:   1 year(s)  Provider:   Vishnu Mallipeddi, MD    We recommend signing up for the patient portal called "MyChart".  Sign up information is provided on this After Visit Summary.  MyChart is used to connect with patients for Virtual Visits (Telemedicine).  Patients are able to view lab/test results, encounter notes, upcoming appointments, etc.  Non-urgent messages can be sent to your provider as well.   To learn more about what you can do with MyChart, go to [email or web address].

## 2023-07-22 ENCOUNTER — Ambulatory Visit: Payer: Medicare Other | Admitting: Cardiovascular Disease

## 2024-01-05 ENCOUNTER — Other Ambulatory Visit: Payer: Self-pay | Admitting: Urology

## 2024-01-15 ENCOUNTER — Encounter (INDEPENDENT_AMBULATORY_CARE_PROVIDER_SITE_OTHER): Payer: Self-pay | Admitting: Gastroenterology

## 2024-06-29 ENCOUNTER — Other Ambulatory Visit

## 2024-07-06 ENCOUNTER — Ambulatory Visit: Admitting: Urology

## 2024-07-13 ENCOUNTER — Ambulatory Visit: Admitting: Internal Medicine
